# Patient Record
Sex: Male | Born: 1985 | Race: White | Hispanic: No | Marital: Married | Smoking: Never smoker
Health system: Southern US, Community
[De-identification: ages and names within clinical notes are randomized; demographics above are authoritative.]

## PROBLEM LIST (undated history)

## (undated) DIAGNOSIS — E162 Hypoglycemia, unspecified: Secondary | ICD-10-CM

## (undated) DIAGNOSIS — F988 Other specified behavioral and emotional disorders with onset usually occurring in childhood and adolescence: Secondary | ICD-10-CM

## (undated) HISTORY — PX: LACERATION REPAIR: SHX5168

## (undated) HISTORY — DX: Other specified behavioral and emotional disorders with onset usually occurring in childhood and adolescence: F98.8

## (undated) HISTORY — DX: Hypoglycemia, unspecified: E16.2

---
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
## 2003-01-02 ENCOUNTER — Encounter: Payer: Self-pay | Admitting: Emergency Medicine

## 2003-01-02 ENCOUNTER — Emergency Department (HOSPITAL_COMMUNITY): Admission: EM | Admit: 2003-01-02 | Discharge: 2003-01-02 | Payer: Self-pay | Admitting: Emergency Medicine

## 2003-06-12 ENCOUNTER — Emergency Department (HOSPITAL_COMMUNITY): Admission: EM | Admit: 2003-06-12 | Discharge: 2003-06-13 | Payer: Self-pay | Admitting: Emergency Medicine

## 2008-03-15 ENCOUNTER — Emergency Department (HOSPITAL_COMMUNITY): Admission: EM | Admit: 2008-03-15 | Discharge: 2008-03-15 | Payer: Self-pay | Admitting: Emergency Medicine

## 2008-07-10 ENCOUNTER — Ambulatory Visit: Payer: Self-pay | Admitting: Internal Medicine

## 2008-07-10 DIAGNOSIS — F988 Other specified behavioral and emotional disorders with onset usually occurring in childhood and adolescence: Secondary | ICD-10-CM | POA: Insufficient documentation

## 2008-07-10 DIAGNOSIS — E162 Hypoglycemia, unspecified: Secondary | ICD-10-CM

## 2008-11-12 ENCOUNTER — Encounter: Admission: RE | Admit: 2008-11-12 | Discharge: 2008-11-12 | Payer: Self-pay | Admitting: Internal Medicine

## 2010-02-01 ENCOUNTER — Emergency Department (HOSPITAL_COMMUNITY): Admission: EM | Admit: 2010-02-01 | Discharge: 2010-02-01 | Payer: Self-pay | Admitting: Emergency Medicine

## 2010-06-16 IMAGING — CT CT ABD-PELV W/O CM
2 of 4 series · 17 of 46 positions shown, 19 images · non-contrast
Comparison: None

CLINICAL DATA: Flank pain

CT ABDOMEN AND PELVIS WITHOUT CONTRAST
TECHNIQUE: Multidetector CT imaging of the abdomen and pelvis was
performed following the standard protocol without intravenous
contrast.

[Series 2: stone_wo 5.0 b40f st · axial · 0.75mm/px · z∈[-482,-76]mm · 14 of 89 slices shown, 16 images]
[im 4/89  soft-tissue]
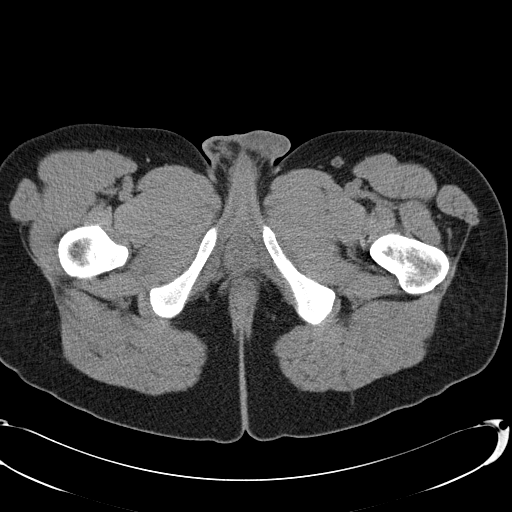
[im 4/89  bone]
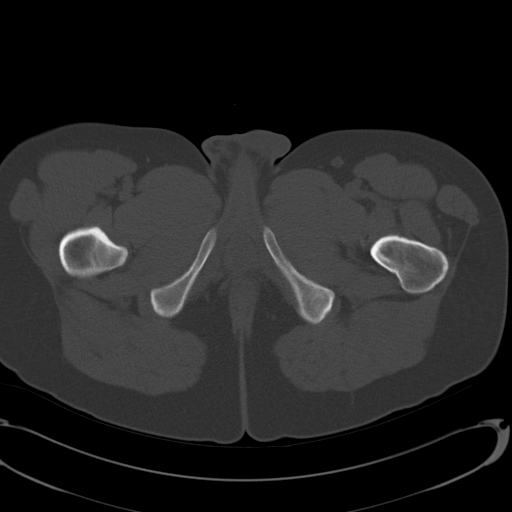
[im 11/89  soft-tissue]
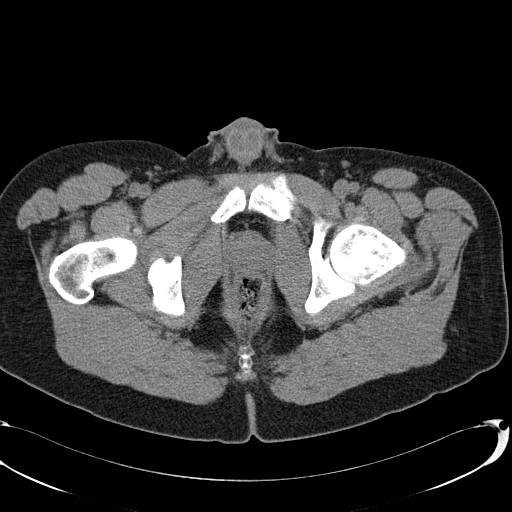
[im 17/89  soft-tissue]
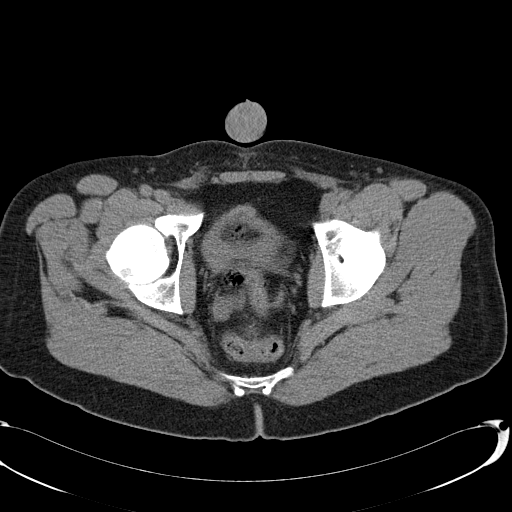
[im 24/89  soft-tissue]
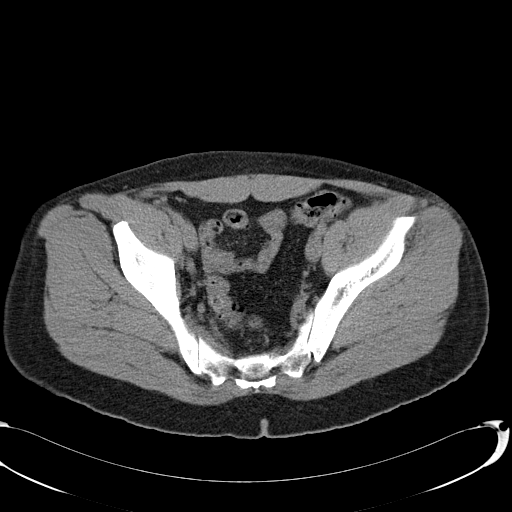
[im 31/89  soft-tissue]
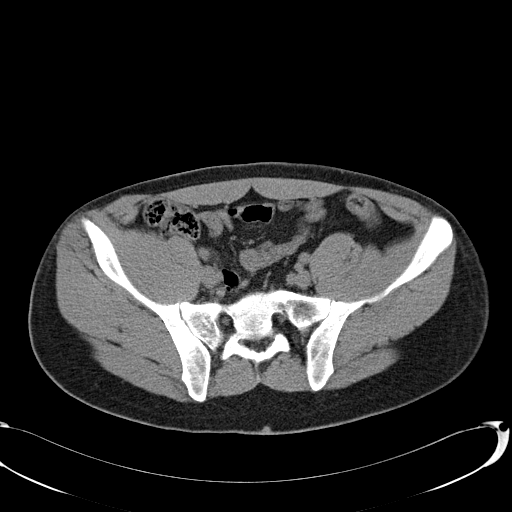
[im 34/89  soft-tissue]
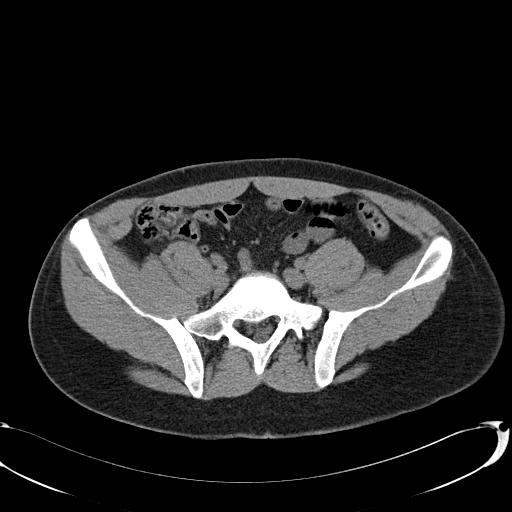
[im 41/89  soft-tissue]
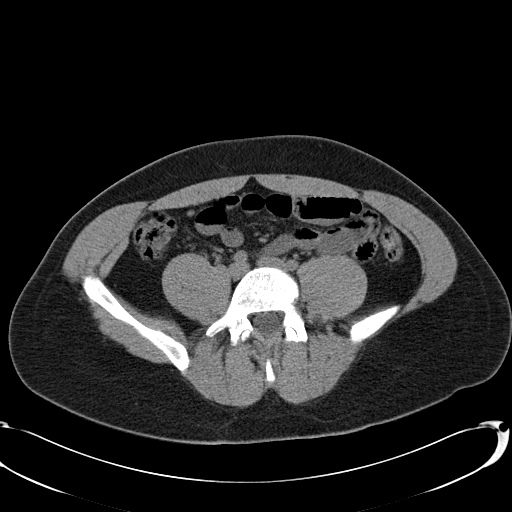
[im 48/89  soft-tissue]
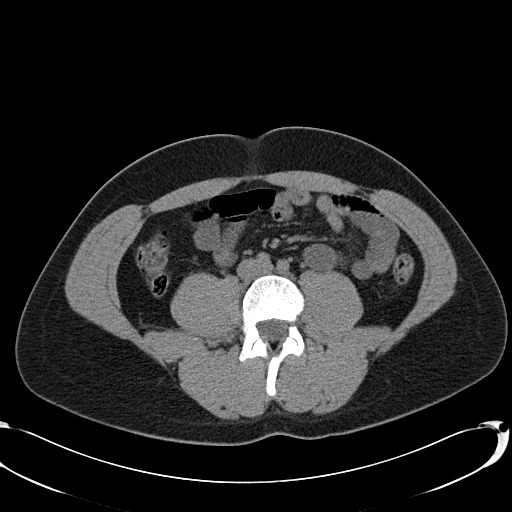
[im 55/89  soft-tissue]
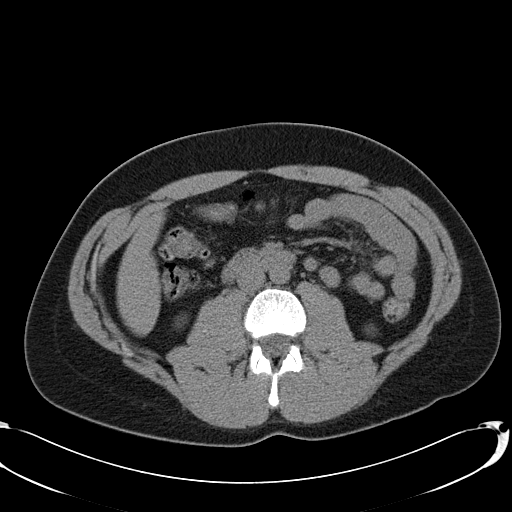
[im 55/89  bone]
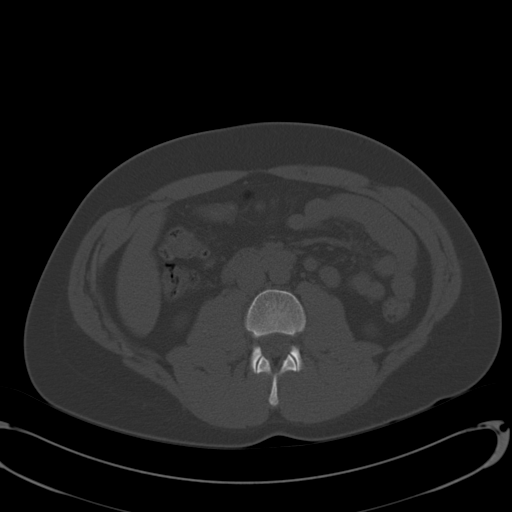
[im 58/89  soft-tissue]
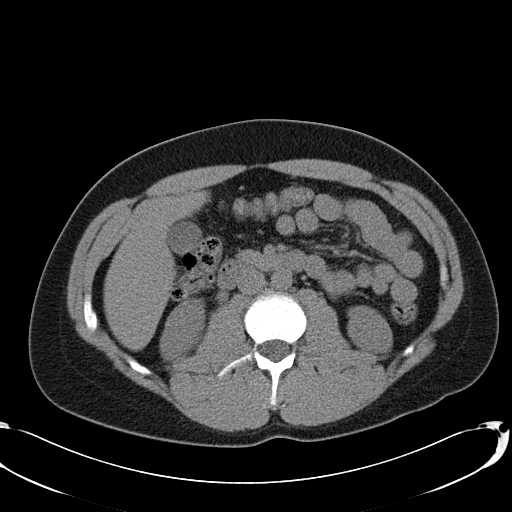
[im 65/89  soft-tissue]
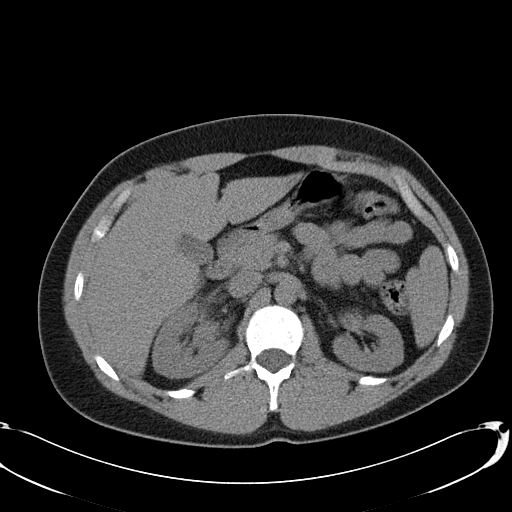
[im 72/89  soft-tissue]
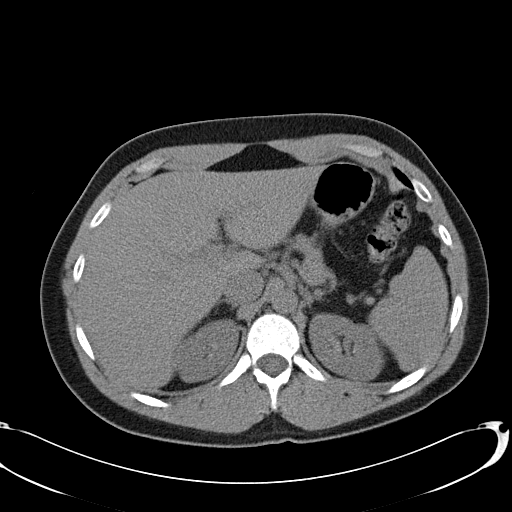
[im 78/89  soft-tissue]
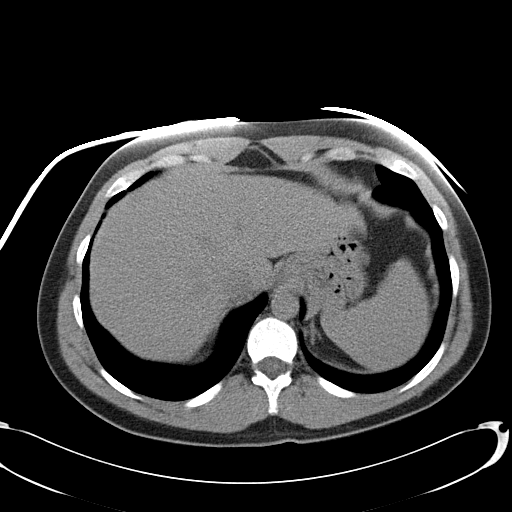
[im 85/89  soft-tissue]
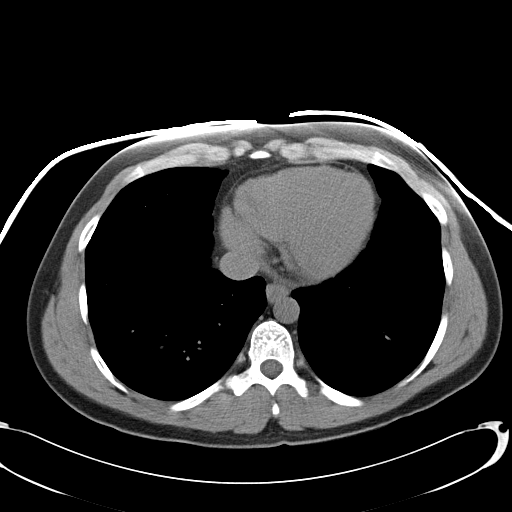

[Series 602: coronal abdomen · coronal · 0.90mm/px · 3 of 121 slices shown]
[im 41/121  soft-tissue]
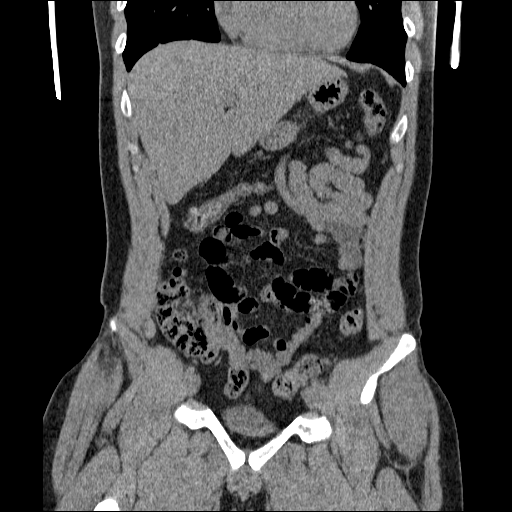
[im 54/121  soft-tissue]
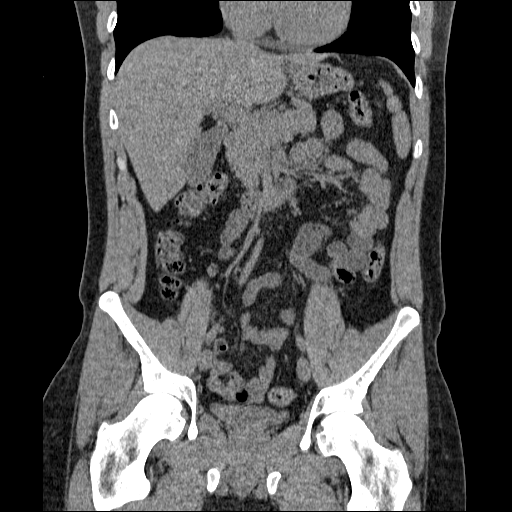
[im 67/121  soft-tissue]
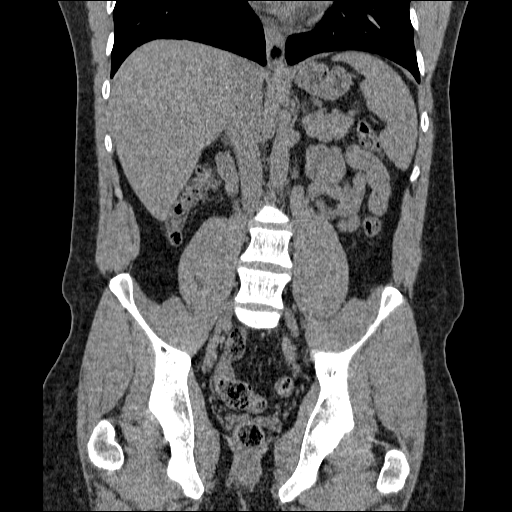

[17 of 46 positions shown; findings below may reference images not displayed]

FINDINGS: The lung bases are clear.

The liver parenchyma appears normal.

The gallbladder is negative.

There is no biliary ductal dilatation. The pancreas appears normal.

The spleen is normal.

Both adrenal glands are normal.

The left kidney appears normal.

There is mild to moderate right-sided hydronephrosis.  4 mm stone
is identified within the right UPJ.

No enlarged lymph nodes within the upper abdomen.

There is no free fluid or abnormal fluid collections.

Appendix identified and normal.

Visualized colon and small bowel are unremarkable.

No free fluid or abnormal fluid collections.

No significant lymphadenopathy.

Urinary bladder is normal.
IMPRESSION: 1.  Right UPJ stone measures 4 mm.
2.  Right-sided hydronephrosis or

## 2011-02-15 LAB — URINE MICROSCOPIC-ADD ON

## 2011-02-15 LAB — URINALYSIS, ROUTINE W REFLEX MICROSCOPIC
Glucose, UA: 100 mg/dL — AB
Specific Gravity, Urine: 1.027 (ref 1.005–1.030)
pH: 8.5 — ABNORMAL HIGH (ref 5.0–8.0)

## 2011-04-17 ENCOUNTER — Inpatient Hospital Stay (HOSPITAL_COMMUNITY): Admission: RE | Admit: 2011-04-17 | Payer: BC Managed Care – PPO | Source: Ambulatory Visit

## 2013-08-03 ENCOUNTER — Other Ambulatory Visit (INDEPENDENT_AMBULATORY_CARE_PROVIDER_SITE_OTHER): Payer: BC Managed Care – PPO

## 2013-08-03 ENCOUNTER — Encounter: Payer: Self-pay | Admitting: Internal Medicine

## 2013-08-03 ENCOUNTER — Ambulatory Visit (INDEPENDENT_AMBULATORY_CARE_PROVIDER_SITE_OTHER): Payer: BC Managed Care – PPO | Admitting: Internal Medicine

## 2013-08-03 VITALS — BP 120/82 | HR 63 | Temp 97.6°F | Wt 221.6 lb

## 2013-08-03 DIAGNOSIS — Z0000 Encounter for general adult medical examination without abnormal findings: Secondary | ICD-10-CM

## 2013-08-03 DIAGNOSIS — E162 Hypoglycemia, unspecified: Secondary | ICD-10-CM

## 2013-08-03 DIAGNOSIS — M7710 Lateral epicondylitis, unspecified elbow: Secondary | ICD-10-CM

## 2013-08-03 DIAGNOSIS — K3189 Other diseases of stomach and duodenum: Secondary | ICD-10-CM

## 2013-08-03 DIAGNOSIS — M7711 Lateral epicondylitis, right elbow: Secondary | ICD-10-CM

## 2013-08-03 DIAGNOSIS — F988 Other specified behavioral and emotional disorders with onset usually occurring in childhood and adolescence: Secondary | ICD-10-CM

## 2013-08-03 DIAGNOSIS — K30 Functional dyspepsia: Secondary | ICD-10-CM

## 2013-08-03 LAB — COMPREHENSIVE METABOLIC PANEL
Albumin: 4.6 g/dL (ref 3.5–5.2)
Alkaline Phosphatase: 49 U/L (ref 39–117)
CO2: 29 mEq/L (ref 19–32)
GFR: 111.33 mL/min (ref >60.00)
Glucose, Bld: 81 mg/dL (ref 70–99)
Potassium: 4.3 mEq/L (ref 3.5–5.1)
Sodium: 138 mEq/L (ref 135–145)
Total Protein: 7.6 g/dL (ref 6.0–8.3)

## 2013-08-03 MED ORDER — AMPHETAMINE-DEXTROAMPHET ER 5 MG PO CP24: 5.0000 mg | ORAL_CAPSULE | ORAL | Status: DC

## 2013-08-03 NOTE — Patient Instructions (Addendum)
1. ADD - an old problem that is resurgent. Plan  Adderall XR 5 mg every AM. Can double to 10 mg (2 tabs) if needed  Report back by MyChart if the dose works and we can prepare additional prescriptions for you  Will need office follow up every 6 months (DEA requirement)  2. 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 You need to avoid high sugar load:  Simple carbohydrates - white rice, white bread, white potatoes, complex is better like sweet potatoes, heavy whole grain products; high protein diet - durable long term slow release energy; regular feedings  3. Arm pain - lateral epicondylitis - an over use inflammation Plan Try not to over use the arm  For flare of pain - rub of choice, NSAID - e.g aleve twice a day short term to relieve the pain, arm band.  4. Stomach - it is in the lower stomach that you have discomfort. May be spastic colon/ IBS type symptoms. Plan First line of treatment is a high fiber diet. Consider a fiber supplement like FiberCon or Benefiber, etc once a day  See handout  5. Health maintenance - routine lab today: cholesterol, sugar level, kidney function  Regular exercise is something you need to build into your life.   Attention Deficit Hyperactivity Disorder Attention deficit hyperactivity disorder (ADHD) is a problem with behavior issues based on the way the brain functions (neurobehavioral disorder). It is a common reason for behavior and academic problems in school. CAUSES  The cause of ADHD is unknown in most cases. It may run in families. It sometimes can be associated with learning disabilities and other behavioral problems. SYMPTOMS  There are 3 types of ADHD. The 3 types and some of the symptoms include:  Inattentive  Gets bored or distracted easily.  Loses or forgets things. Forgets to hand in homework.  Has trouble organizing or completing tasks.  Difficulty staying on task.  An inability to organize daily tasks and school work.  Leaving  projects, chores, or homework unfinished.  Trouble paying attention or responding to details. Careless mistakes.  Difficulty following directions. Often seems like is not listening.  Dislikes activities that require sustained attention (like chores or homework).  Hyperactive-impulsive  Feels like it is impossible to sit still or stay in a seat. Fidgeting with hands and feet.  Trouble waiting turn.  Talking too much or out of turn. Interruptive.  Speaks or acts impulsively.  Aggressive, disruptive behavior.  Constantly busy or on the go, noisy.  Combined  Has symptoms of both of the above. Often children with ADHD feel discouraged about themselves and with school. They often perform well below their abilities in school. These symptoms can cause problems in home, school, and in relationships with peers. As children get older, the excess motor activities can calm down, but the problems with paying attention and staying organized persist. Most children do not outgrow ADHD but with good treatment can learn to cope with the symptoms. DIAGNOSIS  When ADHD is suspected, the diagnosis should be made by professionals trained in ADHD.  Diagnosis will include:  Ruling out other reasons for the child's behavior.  The caregivers will check with the child's school and check their medical records.  They will talk to teachers and parents.  Behavior rating scales for the child will be filled out by those dealing with the child on a daily basis. A diagnosis is made only after all information has been considered. TREATMENT  Treatment usually includes behavioral  treatment often along with medicines. It may include stimulant medicines. The stimulant medicines decrease impulsivity and hyperactivity and increase attention. Other medicines used include antidepressants and certain blood pressure medicines. Most experts agree that treatment for ADHD should address all aspects of the child's functioning.  Treatment should not be limited to the use of medicines alone. Treatment should include structured classroom management. The parents must receive education to address rewarding good behavior, discipline, and limit-setting. Tutoring or behavioral therapy or both should be available for the child. If untreated, the disorder can have long-term serious effects into adolescence and adulthood. HOME CARE INSTRUCTIONS   Often with ADHD there is a lot of frustration among the family in dealing with the illness. There is often blame and anger that is not warranted. This is a life long illness. There is no way to prevent ADHD. In many cases, because the problem affects the family as a whole, the entire family may need help. A therapist can help the family find better ways to handle the disruptive behaviors and promote change. If the child is young, most of the therapist's work is with the parents. Parents will learn techniques for coping with and improving their child's behavior. Sometimes only the child with the ADHD needs counseling. Your caregivers can help you make these decisions.  Children with ADHD may need help in organizing. Some helpful tips include:  Keep routines the same every day from wake-up time to bedtime. Schedule everything. This includes homework and playtime. This should include outdoor and indoor recreation. Keep the schedule on the refrigerator or a bulletin board where it is frequently seen. Mark schedule changes as far in advance as possible.  Have a place for everything and keep everything in its place. This includes clothing, backpacks, and school supplies.  Encourage writing down assignments and bringing home needed books.  Offer your child a well-balanced diet. Breakfast is especially important for school performance. Children should avoid drinks with caffeine including:  Soft drinks.  Coffee.  Tea.  However, some older children (adolescents) may find these drinks helpful in  improving their attention.  Children with ADHD need consistent rules that they can understand and follow. If rules are followed, give small rewards. Children with ADHD often receive, and expect, criticism. Look for good behavior and praise it. Set realistic goals. Give clear instructions. Look for activities that can foster success and self-esteem. Make time for pleasant activities with your child. Give lots of affection.  Parents are their children's greatest advocates. Learn as much as possible about ADHD. This helps you become a stronger and better advocate for your child. It also helps you educate your child's teachers and instructors if they feel inadequate in these areas. Parent support groups are often helpful. A national group with local chapters is called CHADD (Children and Adults with Attention Deficit Hyperactivity Disorder). PROGNOSIS  There is no cure for ADHD. Children with the disorder seldom outgrow it. Many find adaptive ways to accommodate the ADHD as they mature. SEEK MEDICAL CARE IF:  Your child has repeated muscle twitches, cough or speech outbursts.  Your child has sleep problems.  Your child has a marked loss of appetite.  Your child develops depression.  Your child has new or worsening behavioral problems.  Your child develops dizziness.  Your child has a racing heart.  Your child has stomach pains.  Your child develops headaches. Document Released: 10/30/2002 Document Revised: 02/01/2012 Document Reviewed: 06/11/2008 [facility] Patient Information 2014 [place], [place].   Lateral Epicondylitis (  Tennis Elbow) with Rehab Lateral epicondylitis involves inflammation and pain around the outer portion of the elbow. The pain is caused by inflammation of the tendons in the forearm that bring back (extend) the wrist. Lateral epicondylittis is also called tennis elbow, because it is very common in tennis players. However, it may occur in any individual who extends the  wrist repetitively. If lateral epicondylitis is left untreated, it may become a chronic problem. SYMPTOMS   Pain, tenderness, and inflammation on the outer (lateral) side of the elbow.  Pain or weakness with gripping activities.  Pain that increases with wrist twisting motions (playing tennis, using a screwdriver, opening a door or a jar).  Pain with lifting objects, including a coffee cup. CAUSES  Lateral epicondylitis is caused by inflammation of the tendons that extend the wrist. Causes of injury may include:  Repetitive stress and strain on the muscles and tendons that extend the wrist.  Sudden change in activity level or intensity.  Incorrect grip in racquet sports.  Incorrect grip size of racquet (often too large).  Incorrect hitting position or technique (usually backhand, leading with the elbow).  Using a racket that is too heavy. RISK INCREASES WITH:  Sports or occupations that require repetitive and/or strenuous forearm and wrist movements (tennis, squash, racquetball, carpentry).  Poor wrist and forearm strength and flexibility.  Failure to warm up properly before activity.  Resuming activity before healing, rehabilitation, and conditioning are complete. PREVENTION   Warm up and stretch properly before activity.  Maintain physical fitness:  Strength, flexibility, and endurance.  Cardiovascular fitness.  Wear and use properly fitted equipment.  Learn and use proper technique and have a coach correct improper technique.  Wear a tennis elbow (counterforce) brace. PROGNOSIS  The course of this condition depends on the degree of the injury. If treated properly, acute cases (symptoms lasting less than 4 weeks) are often resolved in 2 to 6 weeks. Chronic (longer lasting cases) often resolve in 3 to 6 months, but may require physical therapy. RELATED COMPLICATIONS   Frequently recurring symptoms, resulting in a chronic problem. Properly treating the problem the  first time decreases frequency of recurrence.  Chronic inflammation, scarring tendon degeneration, and partial tendon tear, requiring surgery.  Delayed healing or resolution of symptoms. TREATMENT  Treatment first involves the use of ice and medicine, to reduce pain and inflammation. Strengthening and stretching exercises may help reduce discomfort, if performed regularly. These exercises may be performed at home, if the condition is an acute injury. Chronic cases may require a referral to a physical therapist for evaluation and treatment. Your caregiver may advise a corticosteroid injection, to help reduce inflammation. Rarely, surgery is needed. MEDICATION  If pain medicine is needed, nonsteroidal anti-inflammatory medicines (aspirin and ibuprofen), or other minor pain relievers (acetaminophen), are often advised.  Do not take pain medicine for 7 days before surgery.  Prescription pain relievers may be given, if your caregiver thinks they are needed. Use only as directed and only as much as you need.  Corticosteroid injections may be recommended. These injections should be reserved only for the most severe cases, because they can only be given a certain number of times. HEAT AND COLD  Cold treatment (icing) should be applied for 10 to 15 minutes every 2 to 3 hours for inflammation and pain, and immediately after activity that aggravates your symptoms. Use ice packs or an ice massage.  Heat treatment may be used before performing stretching and strengthening activities prescribed by your caregiver,  physical therapist, or athletic trainer. Use a heat pack or a warm water soak. SEEK MEDICAL CARE IF: Symptoms get worse or do not improve in 2 weeks, despite treatment. EXERCISES  RANGE OF MOTION (ROM) AND STRETCHING EXERCISES - Epicondylitis, Lateral (Tennis Elbow) These exercises may help you when beginning to rehabilitate your injury. Your symptoms may go away with or without further  involvement from your physician, physical therapist or athletic trainer. While completing these exercises, remember:   Restoring tissue flexibility helps normal motion to return to the joints. This allows healthier, less painful movement and activity.  An effective stretch should be held for at least 30 seconds.  A stretch should never be painful. You should only feel a gentle lengthening or release in the stretched tissue. RANGE OF MOTION  Wrist Flexion, Active-Assisted  Extend your right / left elbow with your fingers pointing down.*  Gently pull the back of your hand towards you, until you feel a gentle stretch on the top of your forearm.  Hold this position for __________ seconds. Repeat __________ times. Complete this exercise __________ times per day.  *If directed by your physician, physical therapist or athletic trainer, complete this stretch with your elbow bent, rather than extended. RANGE OF MOTION  Wrist Extension, Active-Assisted  Extend your right / left elbow and turn your palm upwards.*  Gently pull your palm and fingertips back, so your wrist extends and your fingers point more toward the ground.  You should feel a gentle stretch on the inside of your forearm.  Hold this position for __________ seconds. Repeat __________ times. Complete this exercise __________ times per day. *If directed by your physician, physical therapist or athletic trainer, complete this stretch with your elbow bent, rather than extended. STRETCH - Wrist Flexion  Place the back of your right / left hand on a tabletop, leaving your elbow slightly bent. Your fingers should point away from your body.  Gently press the back of your hand down onto the table by straightening your elbow. You should feel a stretch on the top of your forearm.  Hold this position for __________ seconds. Repeat __________ times. Complete this stretch __________ times per day.  STRETCH  Wrist Extension   Place your  right / left fingertips on a tabletop, leaving your elbow slightly bent. Your fingers should point backwards.  Gently press your fingers and palm down onto the table by straightening your elbow. You should feel a stretch on the inside of your forearm.  Hold this position for __________ seconds. Repeat __________ times. Complete this stretch __________ times per day.  STRENGTHENING EXERCISES - Epicondylitis, Lateral (Tennis Elbow) These exercises may help you when beginning to rehabilitate your injury. They may resolve your symptoms with or without further involvement from your physician, physical therapist or athletic trainer. While completing these exercises, remember:   Muscles can gain both the endurance and the strength needed for everyday activities through controlled exercises.  Complete these exercises as instructed by your physician, physical therapist or athletic trainer. Increase the resistance and repetitions only as guided.  You may experience muscle soreness or fatigue, but the pain or discomfort you are trying to eliminate should never worsen during these exercises. If this pain does get worse, stop and make sure you are following the directions exactly. If the pain is still present after adjustments, discontinue the exercise until you can discuss the trouble with your caregiver. STRENGTH Wrist Flexors  Sit with your right / left forearm palm-up and fully  supported on a table or countertop. Your elbow should be resting below the height of your shoulder. Allow your wrist to extend over the edge of the surface.  Loosely holding a __________ weight, or a piece of rubber exercise band or tubing, slowly curl your hand up toward your forearm.  Hold this position for __________ seconds. Slowly lower the wrist back to the starting position in a controlled manner. Repeat __________ times. Complete this exercise __________ times per day.  STRENGTH  Wrist Extensors  Sit with your right /  left forearm palm-down and fully supported on a table or countertop. Your elbow should be resting below the height of your shoulder. Allow your wrist to extend over the edge of the surface.  Loosely holding a __________ weight, or a piece of rubber exercise band or tubing, slowly curl your hand up toward your forearm.  Hold this position for __________ seconds. Slowly lower the wrist back to the starting position in a controlled manner. Repeat __________ times. Complete this exercise __________ times per day.  STRENGTH - Ulnar Deviators  Stand with a ____________________ weight in your right / left hand, or sit while holding a rubber exercise band or tubing, with your healthy arm supported on a table or countertop.  Move your wrist, so that your pinkie travels toward your forearm and your thumb moves away from your forearm.  Hold this position for __________ seconds and then slowly lower the wrist back to the starting position. Repeat __________ times. Complete this exercise __________ times per day STRENGTH - Radial Deviators  Stand with a ____________________ weight in your right / left hand, or sit while holding a rubber exercise band or tubing, with your injured arm supported on a table or countertop.  Raise your hand upward in front of you or pull up on the rubber tubing.  Hold this position for __________ seconds and then slowly lower the wrist back to the starting position. Repeat __________ times. Complete this exercise __________ times per day. STRENGTH  Forearm Supinators   Sit with your right / left forearm supported on a table, keeping your elbow below shoulder height. Rest your hand over the edge, palm down.  Gently grip a hammer or a soup ladle.  Without moving your elbow, slowly turn your palm and hand upward to a "thumbs-up" position.  Hold this position for __________ seconds. Slowly return to the starting position. Repeat __________ times. Complete this exercise  __________ times per day.  STRENGTH  Forearm Pronators   Sit with your right / left forearm supported on a table, keeping your elbow below shoulder height. Rest your hand over the edge, palm up.  Gently grip a hammer or a soup ladle.  Without moving your elbow, slowly turn your palm and hand upward to a "thumbs-up" position.  Hold this position for __________ seconds. Slowly return to the starting position. Repeat __________ times. Complete this exercise __________ times per day.  STRENGTH - Grip  Grasp a tennis ball, a dense sponge, or a large, rolled sock in your hand.  Squeeze as hard as you can, without increasing any pain.  Hold this position for __________ seconds. Release your grip slowly. Repeat __________ times. Complete this exercise __________ times per day.  STRENGTH - Elbow Extensors, Isometric  Stand or sit upright, on a firm surface. Place your right / left arm so that your palm faces your stomach, and it is at the height of your waist.  Place your opposite hand on the  underside of your forearm. Gently push up as your right / left arm resists. Push as hard as you can with both arms, without causing any pain or movement at your right / left elbow. Hold this stationary position for __________ seconds. Gradually release the tension in both arms. Allow your muscles to relax completely before repeating. Document Released: 11/09/2005 Document Revised: 02/01/2012 Document Reviewed: 02/21/2009 [facility] Patient Information 2014 [place], [place].   Diet and Irritable Bowel Syndrome  No cure has been found for irritable bowel syndrome (IBS). Many options are available to treat the symptoms. Your caregiver will give you the best treatments available for your symptoms. He or she will also encourage you to manage stress and to make changes to your diet. You need to work with your caregiver and Registered Dietician to find the best combination of medicine, diet, counseling, and support  to control your symptoms. The following are some diet suggestions. FOODS THAT MAKE IBS WORSE  Fatty foods, such as [place] fries.  Milk products, such as cheese or ice cream.  Chocolate.  Alcohol.  Caffeine (found in coffee and some sodas).  Carbonated drinks, such as soda. If certain foods cause symptoms, you should eat less of them or stop eating them. FOOD JOURNAL   Keep a journal of the foods that seem to cause distress. Write down:  What you are eating during the day and when.  What problems you are having after eating.  When the symptoms occur in relation to your meals.  What foods always make you feel badly.  Take your notes with you to your caregiver to see if you should stop eating certain foods. FOODS THAT MAKE IBS BETTER Fiber reduces IBS symptoms, especially constipation, because it makes stools soft, bulky, and easier to pass. Fiber is found in bran, bread, cereal, beans, fruit, and vegetables. Examples of foods with fiber include:  Apples.  Peaches.  Pears.  Berries.  Figs.  Broccoli, raw.  Cabbage.  Carrots.  Raw peas.  Kidney beans.  Lima beans.  Whole-grain bread.  Whole-grain cereal. Add foods with fiber to your diet a little at a time. This will let your body get used to them. Too much fiber at once might cause gas and swelling of your abdomen. This can trigger symptoms in a person with IBS. Caregivers usually recommend a diet with enough fiber to produce soft, painless bowel movements. High fiber diets may cause gas and bloating. However, these symptoms often go away within a few weeks, as your body adjusts. In many cases, dietary fiber may lessen IBS symptoms, particularly constipation. However, it may not help pain or diarrhea. High fiber diets keep the colon mildly enlarged (distended) with the added fiber. This may help prevent spasms in the colon. Some forms of fiber also keep water in the stool, thereby preventing hard stools that are  difficult to pass.  Besides telling you to eat more foods with fiber, your caregiver may also tell you to get more fiber by taking a fiber pill or drinking water mixed with a special high fiber powder. An example of this is a natural fiber laxative containing psyllium seed.  TIPS  Large meals can cause cramping and diarrhea in people with IBS. If this happens to you, try eating 4 or 5 small meals a day, or try eating less at each of your usual 3 meals. It may also help if your meals are low in fat and high in carbohydrates. Examples of carbohydrates are pasta, rice,  whole-grain breads and cereals, fruits, and vegetables.  If dairy products cause your symptoms to flare up, you can try eating less of those foods. You might be able to handle yogurt better than other dairy products, because it contains bacteria that helps with digestion. Dairy products are an important source of calcium and other nutrients. If you need to avoid dairy products, be sure to talk with a Registered Dietitian about getting these nutrients through other food sources.  Drink enough water and fluids to keep your urine clear or pale yellow. This is important, especially if you have diarrhea. FOR MORE INFORMATION  International Foundation for Functional Gastrointestinal Disorders: www.iffgd.org  National Digestive Diseases Information Clearinghouse: digestive.StageSync.si Document Released: 01/30/2004 Document Revised: 02/01/2012 Document Reviewed: 10/17/2007 [facility] Patient Information 2014 [place], [place].

## 2013-08-03 NOTE — Progress Notes (Signed)
  Subjective:    Patient ID: Aaron Singleton, male    DOB: 1986-02-06, 27 y.o.   MRN: [id number]  HPI [name] presents with a recent c/o left shoulder pain that came on spontaneously but resolved over a period of days. He does not recall any injury.  He also has right elbow pain that will be accompanied by swelling. He does do a lot of physical activity. He does use a forearm band. Currently his arm is not particularly painful.   He has chronic abdominal pain most days. A sharp pain when most active but also a low level of discomfort. Not affected by food or beverage. No N/V. He has chronic loose stools. He does have a lot of flatus and does have occasional bloating. Discomfort does not seem to vary with work load or stress. He has not tried any medication.  He has a h/o ADD and was on adderall in the past: diagnosed in primary school and took it through high school. He is having trouble concentrating, like it was before, and would like to resume medication.  Interval history since last visit in '09 facial laceration left cheek from a flying blade at work.   Past Medical History  Diagnosis Date  . ADD (attention deficit disorder)   . Hypoglycemia, unspecified     reactive hypoglycemia in the past   Past Surgical History  Procedure Laterality Date  . Laceration repair      left zygoma along with underlying fracture   Family History  Problem Relation Age of Onset  . Hyperlipidemia Mother   . Heart disease Maternal Uncle     MI, CABG  . Heart disease Maternal Grandfather   . Diabetes Neg Hx   . COPD Neg Hx   . Cancer Neg Hx    History   Social History  . Marital Status: Married    Spouse Name: [name]    Number of Children: 0  . Years of Education: 14   Occupational History  . Financial planner    Social History Main Topics  . Smoking status: Never Smoker   . Smokeless tobacco: Never Used  . Alcohol Use: Yes     Comment: minimal use   . Drug Use: No  . Sexual Activity:  Yes    Partners: Female   Other Topics Concern  . Not on file   Social History Narrative   HS[employer] '06; Highest level education -some college. Married June '14. Work - Therapist, art. Lives with wife. Marriage is doing well.         No meds  Review of Systems System review is negative for any constitutional, cardiac, pulmonary, GI or neuro symptoms or complaints other than as described in the HPI.     Objective:   Physical Exam Filed Vitals:   08/03/13 1113  BP: 120/82  Pulse: 63  Temp: 97.6 F (36.4 C)   Filed Vitals:   08/03/13 1113  BP: 120/82  Pulse: 63  Temp: 97.6 F (36.4 C)   gen'l WNWD white man in no distress HEENT - [place]/AT, PERRLA Neck supple, no thyromegaly Cor- 2+ radial pulse, RRR Pulm - CTAP Abd - BS+ x 4, no guarding or rebound, no epigastric tenderness, mild tenderness in the lower quadrant L>R Neuro - A&O x 3        Assessment & Plan:

## 2013-08-04 ENCOUNTER — Encounter: Payer: Self-pay | Admitting: Internal Medicine

## 2013-08-05 DIAGNOSIS — K589 Irritable bowel syndrome without diarrhea: Secondary | ICD-10-CM | POA: Insufficient documentation

## 2013-08-05 DIAGNOSIS — M7711 Lateral epicondylitis, right elbow: Secondary | ICD-10-CM | POA: Insufficient documentation

## 2013-08-05 DIAGNOSIS — Z0000 Encounter for general adult medical examination without abnormal findings: Secondary | ICD-10-CM | POA: Insufficient documentation

## 2013-08-05 NOTE — Assessment & Plan Note (Signed)
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   Plan -   avoid high sugar load:  Simple carbohydrates - white rice, white bread, white potatoes, complex is better like sweet potatoes, heavy whole grain products  high protein diet - durable long term slow release energy; regular feedings

## 2013-08-05 NOTE — Assessment & Plan Note (Signed)
it is in the lower stomach that you have discomfort. May be spastic colon/ IBS type symptoms. Plan First line of treatment is a high fiber diet. Consider a fiber supplement like FiberCon or Benefiber, etc once a day  See handout

## 2013-08-05 NOTE — Assessment & Plan Note (Signed)
Generally healthy young man. routine lab today: cholesterol, sugar level, kidney function  Regular exercise is something you need to build into your life.   Recent Results (from the past 2160 hour(s))  LIPID PANEL     Status: Abnormal   Collection Time    08/03/13 12:35 PM      Result Value Range   Cholesterol 170  0 - 200 mg/dL   Comment: ATP III Classification       Desirable:  < 200 mg/dL               Borderline High:  200 - 239 mg/dL          High:  > = 161 mg/dL   Triglycerides 096.0  0.0 - 149.0 mg/dL   Comment: Normal:  <454 mg/dLBorderline High:  150 - 199 mg/dL   HDL 09.81  >19.14 mg/dL   VLDL 78.2  0.0 - 95.6 mg/dL   LDL Cholesterol 213 (*) 0 - 99 mg/dL   Total CHOL/HDL Ratio 4     Comment:                Men          Women1/2 Average Risk     3.4          3.3Average Risk          5.0          4.42X Average Risk          9.6          7.13X Average Risk          15.0          11.0                      COMPREHENSIVE METABOLIC PANEL     Status: None   Collection Time    08/03/13 12:35 PM      Result Value Range   Sodium 138  135 - 145 mEq/L   Potassium 4.3  3.5 - 5.1 mEq/L   Chloride 106  96 - 112 mEq/L   CO2 29  19 - 32 mEq/L   Glucose, Bld 81  70 - 99 mg/dL   BUN 13  6 - 23 mg/dL   Creatinine, Ser 0.9  0.4 - 1.5 mg/dL   Total Bilirubin 0.7  0.3 - 1.2 mg/dL   Alkaline Phosphatase 49  39 - 117 U/L   AST 27  0 - 37 U/L   ALT 41  0 - 53 U/L   Total Protein 7.6  6.0 - 8.3 g/dL   Albumin 4.6  3.5 - 5.2 g/dL   Calcium 9.9  8.4 - 08.6 mg/dL   GFR 578.46  >96.29 mL/min

## 2013-08-05 NOTE — Assessment & Plan Note (Signed)
lateral epicondylitis - an over use inflammation Plan Try not to over use the arm  For flare of pain - rub of choice, NSAID - e.g aleve twice a day short term to relieve the pain, arm band.

## 2013-08-05 NOTE — Assessment & Plan Note (Signed)
ADD - an old problem that is resurgent. Plan  Adderall XR 5 mg every AM. Can double to 10 mg (2 tabs) if needed  Report back by MyChart if the dose works and we can prepare additional prescriptions for you  Will need office follow up every 6 months (DEA requirement)

## 2013-08-09 ENCOUNTER — Telehealth: Payer: Self-pay

## 2013-08-09 NOTE — Telephone Encounter (Signed)
Prior authorization for Dextroamphetamine-Amph Cap Sr has been approved effective 07/10/13 through 08/08/2016.

## 2013-08-20 ENCOUNTER — Encounter: Payer: Self-pay | Admitting: Internal Medicine

## 2013-08-22 ENCOUNTER — Other Ambulatory Visit: Payer: Self-pay

## 2013-08-22 MED ORDER — AMPHETAMINE-DEXTROAMPHET ER 20 MG PO CP24: 20.0000 mg | ORAL_CAPSULE | ORAL | Status: DC

## 2013-09-28 ENCOUNTER — Other Ambulatory Visit: Payer: Self-pay

## 2013-10-24 ENCOUNTER — Telehealth: Payer: Self-pay | Admitting: Internal Medicine

## 2013-10-24 MED ORDER — AMPHETAMINE-DEXTROAMPHET ER 20 MG PO CP24: 20.0000 mg | ORAL_CAPSULE | ORAL | Status: DC

## 2013-10-24 NOTE — Telephone Encounter (Signed)
10/24/2013  Pt is in lobby.  Pt is here to pick up RX amphetamine-dextroamphetamine (ADDERALL XR) 20 MG 24 hr capsule, but there is no RX to pick up.  According to email encounter from 10/17/2013, the RX would be ready for pick up.  Gave info to CMA as well.

## 2013-10-24 NOTE — Telephone Encounter (Signed)
10/24/2013  Pt had to leave, but would like to be called whenever the RX is ready for pickup.  # [phone]

## 2013-10-24 NOTE — Telephone Encounter (Signed)
Phone call to patient letting him know his script for Adderall can be picked up. Script is on my desk so it does not get misplaced this time.

## 2013-12-19 MED ORDER — AMPHETAMINE-DEXTROAMPHET ER 20 MG PO CP24: 20.0000 mg | ORAL_CAPSULE | ORAL | Status: DC

## 2013-12-19 NOTE — Addendum Note (Signed)
Addended by: Lyanne CoANDREWS, Felita Bump R on: 12/19/2013 02:11 PM   Modules accepted: Orders, Medications

## 2015-03-04 ENCOUNTER — Encounter (HOSPITAL_COMMUNITY): Payer: Self-pay | Admitting: Emergency Medicine

## 2015-03-04 ENCOUNTER — Emergency Department (HOSPITAL_COMMUNITY)
Admission: EM | Admit: 2015-03-04 | Discharge: 2015-03-04 | Disposition: A | Discharge status: Home / Self Care | Payer: Managed Care, Other (non HMO) | Attending: Emergency Medicine | Admitting: Emergency Medicine

## 2015-03-04 DIAGNOSIS — F909 Attention-deficit hyperactivity disorder, unspecified type: Secondary | ICD-10-CM | POA: Diagnosis not present

## 2015-03-04 DIAGNOSIS — Z8639 Personal history of other endocrine, nutritional and metabolic disease: Secondary | ICD-10-CM | POA: Diagnosis not present

## 2015-03-04 DIAGNOSIS — R110 Nausea: Secondary | ICD-10-CM | POA: Insufficient documentation

## 2015-03-04 DIAGNOSIS — R109 Unspecified abdominal pain: Secondary | ICD-10-CM | POA: Diagnosis not present

## 2015-03-04 DIAGNOSIS — Z87442 Personal history of urinary calculi: Secondary | ICD-10-CM | POA: Insufficient documentation

## 2015-03-04 LAB — CBC WITH DIFFERENTIAL/PLATELET
BASOS ABS: 0 10*3/uL (ref 0.0–0.1)
BASOS PCT: 0 % (ref 0–1)
EOS ABS: 0 10*3/uL (ref 0.0–0.7)
Eosinophils Relative: 0 % (ref 0–5)
HEMATOCRIT: 46 % (ref 39.0–52.0)
HEMOGLOBIN: 16.4 g/dL (ref 13.0–17.0)
Lymphocytes Relative: 13 % (ref 12–46)
Lymphs Abs: 2.4 10*3/uL (ref 0.7–4.0)
MCH: 31 pg (ref 26.0–34.0)
MCHC: 35.7 g/dL (ref 30.0–36.0)
MCV: 87 fL (ref 78.0–100.0)
MONOS PCT: 4 % (ref 3–12)
Monocytes Absolute: 0.8 10*3/uL (ref 0.1–1.0)
NEUTROS ABS: 16.1 10*3/uL — AB (ref 1.7–7.7)
NEUTROS PCT: 83 % — AB (ref 43–77)
PLATELETS: 354 10*3/uL (ref 150–400)
RBC: 5.29 MIL/uL (ref 4.22–5.81)
RDW: 12.4 % (ref 11.5–15.5)
WBC: 19.4 10*3/uL — AB (ref 4.0–10.5)

## 2015-03-04 LAB — COMPREHENSIVE METABOLIC PANEL
ALT: 44 U/L (ref 0–53)
ANION GAP: 13 (ref 5–15)
AST: 29 U/L (ref 0–37)
Albumin: 4.9 g/dL (ref 3.5–5.2)
Alkaline Phosphatase: 58 U/L (ref 39–117)
BILIRUBIN TOTAL: 0.7 mg/dL (ref 0.3–1.2)
BUN: 17 mg/dL (ref 6–23)
CO2: 19 mmol/L (ref 19–32)
CREATININE: 1.27 mg/dL (ref 0.50–1.35)
Calcium: 10 mg/dL (ref 8.4–10.5)
Chloride: 105 mmol/L (ref 96–112)
GFR calc Af Amer: 86 mL/min — ABNORMAL LOW (ref >90)
GFR, EST NON AFRICAN AMERICAN: 74 mL/min — AB (ref >90)
Glucose, Bld: 164 mg/dL — ABNORMAL HIGH (ref 70–99)
Potassium: 3.8 mmol/L (ref 3.5–5.1)
SODIUM: 137 mmol/L (ref 135–145)
Total Protein: 8.2 g/dL (ref 6.0–8.3)

## 2015-03-04 LAB — URINALYSIS, ROUTINE W REFLEX MICROSCOPIC
BILIRUBIN URINE: NEGATIVE
Glucose, UA: NEGATIVE mg/dL
Hgb urine dipstick: NEGATIVE
Ketones, ur: >80 mg/dL — AB
LEUKOCYTES UA: NEGATIVE
NITRITE: NEGATIVE
Protein, ur: NEGATIVE mg/dL
Specific Gravity, Urine: 1.037 — ABNORMAL HIGH (ref 1.005–1.030)
UROBILINOGEN UA: 0.2 mg/dL (ref 0.0–1.0)
pH: 7 (ref 5.0–8.0)

## 2015-03-04 MED ORDER — HYDROMORPHONE HCL 1 MG/ML IJ SOLN
1.0000 mg | Freq: Once | INTRAMUSCULAR | Status: AC
Start: 2015-03-04 — End: 2015-03-04
  Administered 2015-03-04: 1 mg via INTRAVENOUS
  Filled 2015-03-04: qty 1

## 2015-03-04 MED ORDER — OXYCODONE-ACETAMINOPHEN 5-325 MG PO TABS
1.0000 | ORAL_TABLET | ORAL | Status: AC | PRN
Start: 2015-03-04 — End: ?

## 2015-03-04 MED ORDER — SODIUM CHLORIDE 0.9 % IV BOLUS (SEPSIS)
1000.0000 mL | Freq: Once | INTRAVENOUS | Status: AC
  Administered 2015-03-04: 1000 mL via INTRAVENOUS

## 2015-03-04 MED ORDER — FENTANYL CITRATE 0.05 MG/ML IJ SOLN
50.0000 ug | Freq: Once | INTRAMUSCULAR | Status: AC
  Administered 2015-03-04: 50 ug via NASAL

## 2015-03-04 MED ORDER — FENTANYL CITRATE 0.05 MG/ML IJ SOLN
INTRAMUSCULAR | Status: AC
  Filled 2015-03-04: qty 2

## 2015-03-04 MED ORDER — HYDROMORPHONE HCL 1 MG/ML IJ SOLN
1.0000 mg | Freq: Once | INTRAMUSCULAR | Status: AC
  Administered 2015-03-04: 1 mg via INTRAVENOUS
  Filled 2015-03-04: qty 1

## 2015-03-04 NOTE — ED Notes (Signed)
Pt to room and in gown. Pt complaining of left flank pain. Pt has hx of 6 prior kidney stones.

## 2015-03-04 NOTE — ED Notes (Signed)
Pt verbalized understanding of d/c instructions and has no further questions. Pt to follow up with urologist

## 2015-03-04 NOTE — ED Notes (Signed)
Dr. Wickline in room. 

## 2015-03-04 NOTE — ED Provider Notes (Signed)
CSN: [id number]     Arrival date & time 03/04/15  2018 History   First MD Initiated Contact with Patient 03/04/15 2142     Chief Complaint  Patient presents with  . Nephrolithiasis    The patient has had kidney stones before and he knows this pain is similar.  He is haivng right sided flank pain and rates it 9/10.     Patient is a 29 y.o. male presenting with flank pain. The history is provided by the patient.  Flank Pain This is a new problem. Episode onset: today. The problem occurs constantly. The problem has been gradually worsening. Associated symptoms include abdominal pain. Pertinent negatives include no chest pain and no shortness of breath. Nothing aggravates the symptoms. Nothing relieves the symptoms.  Pt reports onset of left flank pain today He reports nausea He reports this is similar to prior episodes of pain with kidney stones but is worse He denies h/o urologic procedure   Past Medical History  Diagnosis Date  . ADD (attention deficit disorder)   . Hypoglycemia, unspecified     reactive hypoglycemia in the past   Past Surgical History  Procedure Laterality Date  . Laceration repair      left zygoma along with underlying fracture   Family History  Problem Relation Age of Onset  . Hyperlipidemia Mother   . Heart disease Maternal Uncle     MI, CABG  . Heart disease Maternal Grandfather   . Diabetes Neg Hx   . COPD Neg Hx   . Cancer Neg Hx    History  Substance Use Topics  . Smoking status: Never Smoker   . Smokeless tobacco: Never Used  . Alcohol Use: Yes     Comment: minimal use     Review of Systems  Constitutional: Positive for chills. Negative for fever.  Respiratory: Negative for shortness of breath.   Cardiovascular: Negative for chest pain.  Gastrointestinal: Positive for abdominal pain.  Genitourinary: Positive for flank pain and difficulty urinating.  Neurological: Negative for weakness.  All other systems reviewed and are  negative.     Allergies  Codeine  Home Medications   Prior to Admission medications   Medication Sig Start Date End Date Taking? Authorizing Provider  amphetamine-dextroamphetamine (ADDERALL XR) 20 MG 24 hr capsule Take 1 capsule (20 mg total) by mouth every morning. Patient not taking: Reported on 03/04/2015 10/24/13   Jacques Navy, MD  amphetamine-dextroamphetamine (ADDERALL XR) 20 MG 24 hr capsule Take 1 capsule (20 mg total) by mouth every morning. MAY FILL 02/16/14 Patient not taking: Reported on 03/04/2015 12/19/13   Jacques Navy, MD   BP 128/75 mmHg  Pulse 90  Temp(Src) 97.3 F (36.3 C) (Oral)  Resp 18  SpO2 94% Physical Exam CONSTITUTIONAL: Well developed/well nourished, pt is uncomfortable appearing HEAD: Normocephalic/atraumatic EYES: EOMI ENMT: Mucous membranes moist NECK: supple no meningeal signs SPINE/BACK:entire spine nontender CV: S1/S2 noted, no murmurs/rubs/gallops noted LUNGS: Lungs are clear to auscultation bilaterally, no apparent distress ABDOMEN: soft, nontender, no rebound or guarding, bowel sounds noted throughout abdomen WU:[id number] cva tenderness NEURO: Pt is awake/alert/appropriate, moves all extremitiesx4.  No facial droop.   EXTREMITIES: pulses normal/equal, full ROM SKIN: warm, color normal PSYCH: anxious  ED Course  Procedures   Medications  fentaNYL (SUBLIMAZE) 0.05 MG/ML injection (not administered)  fentaNYL (SUBLIMAZE) injection 50 mcg (50 mcg Nasal Given 03/04/15 2034)  HYDROmorphone (DILAUDID) injection 1 mg (1 mg Intravenous Given 03/04/15 2146)  sodium chloride 0.9 %  bolus 1,000 mL (1,000 mLs Intravenous New Bag/Given 03/04/15 2145)  HYDROmorphone (DILAUDID) injection 1 mg (1 mg Intravenous Given 03/04/15 2215)    Labs Review Labs Reviewed  COMPREHENSIVE METABOLIC PANEL - Abnormal; Notable for the following:    Glucose, Bld 164 (*)    GFR calc non Af Amer 74 (*)    GFR calc Af Amer 86 (*)    All other components within normal  limits  CBC WITH DIFFERENTIAL/PLATELET - Abnormal; Notable for the following:    WBC 19.4 (*)    Neutrophils Relative % 83 (*)    Neutro Abs 16.1 (*)    All other components within normal limits  URINALYSIS, ROUTINE W REFLEX MICROSCOPIC   11:31 PM PT WITH H/O LIKELY URETERAL STONE GIVEN HISTORY/EXAM HE IS MUCH IMPROVED, SMILING AND IN NO DISTRESS GIVEN COMPLETE RESOLUTION OF PAIN I DON'T FEEL IMAGING REQUIRED PT IS AGREEABLE WITH THIS PLAN    MDM   Final diagnoses:  Flank pain    Nursing notes including past medical history and social history reviewed and considered in documentation Labs/vital reviewed myself and considered during evaluation     Zadie Rhineonald Lurene Robley, MD 03/04/15 2332

## 2015-03-04 NOTE — Discharge Instructions (Signed)
YOU WERE GIVEN FENTANYL AND DILAUDID FOR YOUR SEVERE PAIN DUE TO KIDNEY STONES.

## 2015-03-04 NOTE — ED Notes (Signed)
The patient has had kidney stones before and he knows this pain is similar.  He is haivng right sided flank pain and rates it 9/10.  He says he usually has blood in his urine but this time he denies hematuria.

## 2020-12-03 ENCOUNTER — Ambulatory Visit (INDEPENDENT_AMBULATORY_CARE_PROVIDER_SITE_OTHER): Payer: BC Managed Care – PPO

## 2020-12-03 ENCOUNTER — Ambulatory Visit (INDEPENDENT_AMBULATORY_CARE_PROVIDER_SITE_OTHER): Payer: BC Managed Care – PPO | Admitting: Podiatry

## 2020-12-03 ENCOUNTER — Other Ambulatory Visit: Payer: Self-pay

## 2020-12-03 DIAGNOSIS — M722 Plantar fascial fibromatosis: Secondary | ICD-10-CM | POA: Diagnosis not present

## 2020-12-03 DIAGNOSIS — M79673 Pain in unspecified foot: Secondary | ICD-10-CM | POA: Diagnosis not present

## 2020-12-03 MED ORDER — MELOXICAM 15 MG PO TABS: 15.0000 mg | ORAL_TABLET | Freq: Every day | ORAL | 0 refills | Status: DC

## 2020-12-03 MED ORDER — TRIAMCINOLONE ACETONIDE 10 MG/ML IJ SUSP
10.0000 mg | Freq: Once | INTRAMUSCULAR | Status: AC
  Administered 2020-12-03: 10 mg

## 2020-12-03 NOTE — Patient Instructions (Signed)
For instructions on how to put on your Plantar Fascial Brace, please visit [email or web address]   Plantar Fasciitis (Heel Spur Syndrome) with Rehab The plantar fascia is a fibrous, ligament-like, soft-tissue structure that spans the bottom of the foot. Plantar fasciitis is a condition that causes pain in the foot due to inflammation of the tissue. SYMPTOMS   Pain and tenderness on the underneath side of the foot.  Pain that worsens with standing or walking. CAUSES  Plantar fasciitis is caused by irritation and injury to the plantar fascia on the underneath side of the foot. Common mechanisms of injury include:  Direct trauma to bottom of the foot.  Damage to a small nerve that runs under the foot where the main fascia attaches to the heel bone.  Stress placed on the plantar fascia due to bone spurs. RISK INCREASES WITH:   Activities that place stress on the plantar fascia (running, jumping, pivoting, or cutting).  Poor strength and flexibility.  Improperly fitted shoes.  Tight calf muscles.  Flat feet.  Failure to warm-up properly before activity.  Obesity. PREVENTION  Warm up and stretch properly before activity.  Allow for adequate recovery between workouts.  Maintain physical fitness:  Strength, flexibility, and endurance.  Cardiovascular fitness.  Maintain a health body weight.  Avoid stress on the plantar fascia.  Wear properly fitted shoes, including arch supports for individuals who have flat feet.  PROGNOSIS  If treated properly, then the symptoms of plantar fasciitis usually resolve without surgery. However, occasionally surgery is necessary.  RELATED COMPLICATIONS   Recurrent symptoms that may result in a chronic condition.  Problems of the lower back that are caused by compensating for the injury, such as limping.  Pain or weakness of the foot during push-off following surgery.  Chronic inflammation, scarring, and partial or complete  fascia tear, occurring more often from repeated injections.  TREATMENT  Treatment initially involves the use of ice and medication to help reduce pain and inflammation. The use of strengthening and stretching exercises may help reduce pain with activity, especially stretches of the Achilles tendon. These exercises may be performed at home or with a therapist. Your caregiver may recommend that you use heel cups of arch supports to help reduce stress on the plantar fascia. Occasionally, corticosteroid injections are given to reduce inflammation. If symptoms persist for greater than 6 months despite non-surgical (conservative), then surgery may be recommended.   MEDICATION   If pain medication is necessary, then nonsteroidal anti-inflammatory medications, such as aspirin and ibuprofen, or other minor pain relievers, such as acetaminophen, are often recommended.  Do not take pain medication within 7 days before surgery.  Prescription pain relievers may be given if deemed necessary by your caregiver. Use only as directed and only as much as you need.  Corticosteroid injections may be given by your caregiver. These injections should be reserved for the most serious cases, because they may only be given a certain number of times.  HEAT AND COLD  Cold treatment (icing) relieves pain and reduces inflammation. Cold treatment should be applied for 10 to 15 minutes every 2 to 3 hours for inflammation and pain and immediately after any activity that aggravates your symptoms. Use ice packs or massage the area with a piece of ice (ice massage).  Heat treatment may be used prior to performing the stretching and strengthening activities prescribed by your caregiver, physical therapist, or athletic trainer. Use a heat pack or soak the injury in warm water.  SEEK IMMEDIATE MEDICAL   CARE IF:  Treatment seems to offer no benefit, or the condition worsens.  Any medications produce adverse side effects.   EXERCISES- RANGE OF MOTION (ROM) AND STRETCHING EXERCISES - Plantar Fasciitis (Heel Spur Syndrome) These exercises may help you when beginning to rehabilitate your injury. Your symptoms may resolve with or without further involvement from your physician, physical therapist or athletic trainer. While completing these exercises, remember:   Restoring tissue flexibility helps normal motion to return to the joints. This allows healthier, less painful movement and activity.  An effective stretch should be held for at least 30 seconds.  A stretch should never be painful. You should only feel a gentle lengthening or release in the stretched tissue.  RANGE OF MOTION - Toe Extension, Flexion  Sit with your right / left leg crossed over your opposite knee.  Grasp your toes and gently pull them back toward the top of your foot. You should feel a stretch on the bottom of your toes and/or foot.  Hold this stretch for 10 seconds.  Now, gently pull your toes toward the bottom of your foot. You should feel a stretch on the top of your toes and or foot.  Hold this stretch for 10 seconds. Repeat  times. Complete this stretch 3 times per day.   RANGE OF MOTION - Ankle Dorsiflexion, Active Assisted  Remove shoes and sit on a chair that is preferably not on a carpeted surface.  Place right / left foot under knee. Extend your opposite leg for support.  Keeping your heel down, slide your right / left foot back toward the chair until you feel a stretch at your ankle or calf. If you do not feel a stretch, slide your bottom forward to the edge of the chair, while still keeping your heel down.  Hold this stretch for 10 seconds. Repeat 3 times. Complete this stretch 2 times per day.   STRETCH  Gastroc, Standing  Place hands on wall.  Extend right / left leg, keeping the front knee somewhat bent.  Slightly point your toes inward on your back foot.  Keeping your right / left heel on the floor and your  knee straight, shift your weight toward the wall, not allowing your back to arch.  You should feel a gentle stretch in the right / left calf. Hold this position for 10 seconds. Repeat 3 times. Complete this stretch 2 times per day.  STRETCH  Soleus, Standing  Place hands on wall.  Extend right / left leg, keeping the other knee somewhat bent.  Slightly point your toes inward on your back foot.  Keep your right / left heel on the floor, bend your back knee, and slightly shift your weight over the back leg so that you feel a gentle stretch deep in your back calf.  Hold this position for 10 seconds. Repeat 3 times. Complete this stretch 2 times per day.  STRETCH  Gastrocsoleus, Standing  Note: This exercise can place a lot of stress on your foot and ankle. Please complete this exercise only if specifically instructed by your caregiver.   Place the ball of your right / left foot on a step, keeping your other foot firmly on the same step.  Hold on to the wall or a rail for balance.  Slowly lift your other foot, allowing your body weight to press your heel down over the edge of the step.  You should feel a stretch in your right / left calf.  Hold this   position for 10 seconds.  Repeat this exercise with a slight bend in your right / left knee. Repeat 3 times. Complete this stretch 2 times per day.   STRENGTHENING EXERCISES - Plantar Fasciitis (Heel Spur Syndrome)  These exercises may help you when beginning to rehabilitate your injury. They may resolve your symptoms with or without further involvement from your physician, physical therapist or athletic trainer. While completing these exercises, remember:   Muscles can gain both the endurance and the strength needed for everyday activities through controlled exercises.  Complete these exercises as instructed by your physician, physical therapist or athletic trainer. Progress the resistance and repetitions only as guided.  STRENGTH -  Towel Curls  Sit in a chair positioned on a non-carpeted surface.  Place your foot on a towel, keeping your heel on the floor.  Pull the towel toward your heel by only curling your toes. Keep your heel on the floor. Repeat 3 times. Complete this exercise 2 times per day.  STRENGTH - Ankle Inversion  Secure one end of a rubber exercise band/tubing to a fixed object (table, pole). Loop the other end around your foot just before your toes.  Place your fists between your knees. This will focus your strengthening at your ankle.  Slowly, pull your big toe up and in, making sure the band/tubing is positioned to resist the entire motion.  Hold this position for 10 seconds.  Have your muscles resist the band/tubing as it slowly pulls your foot back to the starting position. Repeat 3 times. Complete this exercises 2 times per day.  Document Released: 11/09/2005 Document Revised: 02/01/2012 Document Reviewed: 02/21/2009 [employer] Patient Information 2014 [employer]. 

## 2020-12-08 NOTE — Progress Notes (Signed)
Subjective:   Patient ID: Aaron Singleton, male   DOB: 35 y.o.   MRN: [id number]   HPI 35 year old male presents the office today for concerns of bilateral heel pain as well as pain going into the arch of the foot which is been ongoing about 1 year.  He has been taking diclofenac in the morning which does help when he takes it.  He only takes this once in the morning.  He states the left is equal to the right and gets pain at morning when he first gets up and also gets worse throughout the day.  Denies any recent injury.  No swelling.  He has no other concerns today.   Review of Systems  All other systems reviewed and are negative.  Past Medical History:  Diagnosis Date  . ADD (attention deficit disorder)   . Hypoglycemia, unspecified    reactive hypoglycemia in the past    Past Surgical History:  Procedure Laterality Date  . LACERATION REPAIR     left zygoma along with underlying fracture     Current Outpatient Medications:  .  meloxicam (MOBIC) 15 MG tablet, Take 1 tablet (15 mg total) by mouth daily., Disp: 30 tablet, Rfl: 0 .  oxyCODONE-acetaminophen (PERCOCET/ROXICET) 5-325 MG per tablet, Take 1 tablet by mouth every 4 (four) hours as needed for severe pain., Disp: 15 tablet, Rfl: 0  Allergies  Allergen Reactions  . Codeine     REACTION: Itching         Objective:  Physical Exam  General: AAO x3, NAD  Dermatological: Skin is warm, dry and supple bilateral. There are no open sores, no preulcerative lesions, no rash or signs of infection present.  Vascular: Dorsalis Pedis artery and Posterior Tibial artery pedal pulses are 2/4 bilateral with immedate capillary fill time. There is no pain with calf compression, swelling, warmth, erythema.   Neruologic: Grossly intact via light touch bilateral. Negative tinel sign.   Musculoskeletal: There is tenderness palpation on plantar medial tubercle of the calcaneus at the insertion of the plantar fascia bilaterally.  Mild  discomfort the arch of the foot as well as the course of plantar fascia.  The plantar fascial appears to be intact.  There is no pain in the Achilles tendon.  Achilles tendon is intact.  No edema, erythema.  Muscular strength 5/5 in all groups tested bilateral. Cavus foot type.   Gait: Unassisted, Nonantalgic.       Assessment:   35 year old male with foot pain, plantar fasciitis     Plan:  -Treatment options discussed including all alternatives, risks, and complications -Etiology of symptoms were discussed -X-rays were obtained and reviewed with the patient. There is no evidence of acute fracture or stress fracture identified today. -Bilateral steroid injection performed.  See procedure note below. -He has a night splint at home -Plantar fascial brace dispensed x2 -Discussed orthotics.  He was seen today by our pedorthotist, Raiford Noble, for measurement of orthotics.  -Stretching/icing daily  Procedure: Injection Tendon/Ligament Discussed alternatives, risks, complications and verbal consent was obtained.  Location: Bilateral plantar fascia at the glabrous junction; medial approach. Skin Prep: Alcohol  Injectate: 0.5cc 0.5% marcaine plain, 0.5 cc 2% lidocaine plain and, 1 cc kenalog 10. Disposition: Patient tolerated procedure well. Injection site dressed with a band-aid.  Post-injection care was discussed and return precautions discussed.    Return for heel pain, plantar fasciitis.  Vivi Barrack DPM

## 2021-01-07 ENCOUNTER — Ambulatory Visit: Payer: BC Managed Care – PPO | Admitting: Podiatry

## 2021-01-29 ENCOUNTER — Other Ambulatory Visit: Payer: Self-pay

## 2021-01-29 ENCOUNTER — Ambulatory Visit (INDEPENDENT_AMBULATORY_CARE_PROVIDER_SITE_OTHER): Payer: BC Managed Care – PPO | Admitting: Podiatry

## 2021-01-29 DIAGNOSIS — M722 Plantar fascial fibromatosis: Secondary | ICD-10-CM | POA: Diagnosis not present

## 2021-01-29 DIAGNOSIS — M7730 Calcaneal spur, unspecified foot: Secondary | ICD-10-CM

## 2021-01-30 ENCOUNTER — Encounter: Payer: Self-pay | Admitting: Podiatry

## 2021-01-30 NOTE — Progress Notes (Signed)
  Subjective:  Patient ID: Aaron Singleton, male    DOB: August 28, 1986,  MRN: [id number]  Chief Complaint  Patient presents with  . Plantar Fasciitis    PT stated that his plantar fasciitis has flared back up     35 y.o. male presents with the above complaint.  Patient presents with a flareup of plantar fasciitis.  Patient states that she he was treated by Dr. Loreta Ave who gave her bracing and had discussed getting orthotics however he did not get the orthotics.  He states the pain eventually came back again.  He would like to rediscuss treatment plans.  He denies any other acute complaints.  He does not have his older brace as it was never started to wear out.   Review of Systems: Negative except as noted in the HPI. Denies N/V/F/Ch.  Past Medical History:  Diagnosis Date  . ADD (attention deficit disorder)   . Hypoglycemia, unspecified    reactive hypoglycemia in the past    Current Outpatient Medications:  .  meloxicam (MOBIC) 15 MG tablet, Take 1 tablet (15 mg total) by mouth daily., Disp: 30 tablet, Rfl: 0 .  oxyCODONE-acetaminophen (PERCOCET/ROXICET) 5-325 MG per tablet, Take 1 tablet by mouth every 4 (four) hours as needed for severe pain., Disp: 15 tablet, Rfl: 0  Social History   Tobacco Use  Smoking Status Never Smoker  Smokeless Tobacco Never Used    Allergies  Allergen Reactions  . Codeine     REACTION: Itching   Objective:  There were no vitals filed for this visit. There is no height or weight on file to calculate BMI. Constitutional Well developed. Well nourished.  Vascular Dorsalis pedis pulses palpable bilaterally. Posterior tibial pulses palpable bilaterally. Capillary refill normal to all digits.  No cyanosis or clubbing noted. Pedal hair growth normal.  Neurologic Normal speech. Oriented to person, place, and time. Epicritic sensation to light touch grossly present bilaterally.  Dermatologic Nails well groomed and normal in appearance. No open  wounds. No skin lesions.  Orthopedic: Normal joint ROM without pain or crepitus bilaterally. No visible deformities. Tender to palpation at the calcaneal tuber bilaterally. No pain with calcaneal squeeze bilaterally. Ankle ROM diminished range of motion bilaterally. Silfverskiold Test: positive bilaterally.   Radiographs: Taken and reviewed. No acute fractures or dislocations. No evidence of stress fracture.  Plantar heel spur present. Posterior heel spur absent.   Assessment:   1. Plantar fasciitis of right foot   2. Plantar fasciitis of left foot   3. Heel spur, unspecified laterality    Plan:  Patient was evaluated and treated and all questions answered.  Plantar Fasciitis, bilaterally - XR reviewed as above.  - Educated on icing and stretching. Instructions given.  - Injection delivered to the plantar fascia as below. - DME: Plantar Fascial Brace x2 - Pharmacologic management: None  Procedure: Injection Tendon/Ligament Location: Bilateral plantar fascia at the glabrous junction; medial approach. Skin Prep: alcohol Injectate: 0.5 cc 0.5% marcaine plain, 0.5 cc of 1% Lidocaine, 0.5 cc kenalog 10. Disposition: Patient tolerated procedure well. Injection site dressed with a band-aid.  No follow-ups on file.

## 2021-02-03 ENCOUNTER — Telehealth: Payer: Self-pay | Admitting: *Deleted

## 2021-02-03 MED ORDER — MELOXICAM 15 MG PO TABS: 15.0000 mg | ORAL_TABLET | Freq: Every day | ORAL | 0 refills | Status: DC

## 2021-02-03 NOTE — Telephone Encounter (Signed)
That's fine you can refill it

## 2021-02-03 NOTE — Telephone Encounter (Signed)
Patient's wife is calling to request a medication refill (Mobic 15mg ), last seen in office by Dr on 01/29/21. Please advise.

## 2021-02-05 ENCOUNTER — Other Ambulatory Visit: Payer: Self-pay | Admitting: *Deleted

## 2021-02-05 NOTE — Telephone Encounter (Signed)
Called and left Vmessage that medication for Mobic -15 mg has been sent to pharmacy on file.

## 2021-02-25 ENCOUNTER — Other Ambulatory Visit: Payer: Self-pay | Admitting: Podiatry

## 2021-02-25 ENCOUNTER — Other Ambulatory Visit: Payer: Self-pay

## 2021-02-25 ENCOUNTER — Ambulatory Visit (INDEPENDENT_AMBULATORY_CARE_PROVIDER_SITE_OTHER): Payer: BC Managed Care – PPO | Admitting: Podiatry

## 2021-02-25 DIAGNOSIS — M79673 Pain in unspecified foot: Secondary | ICD-10-CM

## 2021-02-25 DIAGNOSIS — M722 Plantar fascial fibromatosis: Secondary | ICD-10-CM

## 2021-02-25 DIAGNOSIS — M7730 Calcaneal spur, unspecified foot: Secondary | ICD-10-CM

## 2021-02-25 NOTE — Progress Notes (Signed)
Patient presents to be casted for orthotics.  A foam impression was casted for his right and left foot.  Patient is a size 10  Patient will be contacted when the orthotics are ready for pick up.

## 2021-02-25 NOTE — Telephone Encounter (Signed)
Please advise 

## 2021-02-26 ENCOUNTER — Encounter: Payer: Self-pay | Admitting: Podiatry

## 2021-02-26 ENCOUNTER — Ambulatory Visit (INDEPENDENT_AMBULATORY_CARE_PROVIDER_SITE_OTHER): Payer: BC Managed Care – PPO | Admitting: Podiatry

## 2021-02-26 DIAGNOSIS — Q666 Other congenital valgus deformities of feet: Secondary | ICD-10-CM | POA: Diagnosis not present

## 2021-02-26 DIAGNOSIS — M722 Plantar fascial fibromatosis: Secondary | ICD-10-CM | POA: Diagnosis not present

## 2021-02-26 NOTE — Progress Notes (Signed)
  Subjective:  Patient ID: Aaron Singleton, male    DOB: 04-15-1986,  MRN: [id number]  Chief Complaint  Patient presents with  . Foot Pain    PT stated that he is still having some pain with his right foot     35 y.o. male presents with the above complaint.  Patient presents with follow-up of flare of plantar fasciitis.  Patient states injection helped however he still has pretty good amount of residual pain right greater than left now.  He states it is stable.  Painful.  It is improving.  He denies any other acute complaints.  He has not obtained his orthotics yet.   Review of Systems: Negative except as noted in the HPI. Denies N/V/F/Ch.  Past Medical History:  Diagnosis Date  . ADD (attention deficit disorder)   . Hypoglycemia, unspecified    reactive hypoglycemia in the past    Current Outpatient Medications:  .  meloxicam (MOBIC) 15 MG tablet, Take 1 tablet (15 mg total) by mouth daily., Disp: 30 tablet, Rfl: 0 .  oxyCODONE-acetaminophen (PERCOCET/ROXICET) 5-325 MG per tablet, Take 1 tablet by mouth every 4 (four) hours as needed for severe pain., Disp: 15 tablet, Rfl: 0  Social History   Tobacco Use  Smoking Status Never Smoker  Smokeless Tobacco Never Used    Allergies  Allergen Reactions  . Codeine     REACTION: Itching   Objective:  There were no vitals filed for this visit. There is no height or weight on file to calculate BMI. Constitutional Well developed. Well nourished.  Vascular Dorsalis pedis pulses palpable bilaterally. Posterior tibial pulses palpable bilaterally. Capillary refill normal to all digits.  No cyanosis or clubbing noted. Pedal hair growth normal.  Neurologic Normal speech. Oriented to person, place, and time. Epicritic sensation to light touch grossly present bilaterally.  Dermatologic Nails well groomed and normal in appearance. No open wounds. No skin lesions.  Orthopedic: Normal joint ROM without pain or crepitus bilaterally. No  visible deformities. Tender to palpation at the calcaneal tuber bilaterally. No pain with calcaneal squeeze bilaterally. Ankle ROM diminished range of motion bilaterally. Silfverskiold Test: positive bilaterally.   Radiographs: Taken and reviewed. No acute fractures or dislocations. No evidence of stress fracture.  Plantar heel spur present. Posterior heel spur absent.   Assessment:   1. Plantar fasciitis of right foot   2. Plantar fasciitis of left foot   3. Pes planovalgus    Plan:  Patient was evaluated and treated and all questions answered.  Plantar Fasciitis, bilaterally - XR reviewed as above.  - Educated on icing and stretching. Instructions given.  -Second injection delivered to the plantar fascia as below. - DME: Plantar Fascial Brace x2 - Pharmacologic management: None  Pes planovalgus -I explained patient the etiology of pes planovalgus and pressure but options were discussed.  Patient will ultimately benefit from orthotics for more prevention to control the hindfoot motion and support the arch of the foot and take the stress away from plantar fascia.  Patient agrees with the plan. -He is awaiting orthotics.  Procedure: Injection Tendon/Ligament Location: Bilateral plantar fascia at the glabrous junction; medial approach. Skin Prep: alcohol Injectate: 0.5 cc 0.5% marcaine plain, 0.5 cc of 1% Lidocaine, 0.5 cc kenalog 10. Disposition: Patient tolerated procedure well. Injection site dressed with a band-aid.  No follow-ups on file.

## 2021-03-26 ENCOUNTER — Other Ambulatory Visit: Payer: Self-pay

## 2021-03-26 ENCOUNTER — Ambulatory Visit (INDEPENDENT_AMBULATORY_CARE_PROVIDER_SITE_OTHER): Payer: BC Managed Care – PPO | Admitting: Podiatry

## 2021-03-26 ENCOUNTER — Encounter: Payer: Self-pay | Admitting: Podiatry

## 2021-03-26 DIAGNOSIS — M722 Plantar fascial fibromatosis: Secondary | ICD-10-CM | POA: Diagnosis not present

## 2021-03-26 DIAGNOSIS — Q666 Other congenital valgus deformities of feet: Secondary | ICD-10-CM | POA: Diagnosis not present

## 2021-03-26 NOTE — Progress Notes (Signed)
  Subjective:  Patient ID: Aaron Singleton, male    DOB: September 11, 1986,  MRN: [id number]  Chief Complaint  Patient presents with  . Plantar Fasciitis    Pt stated that he is doing well he has very little pain     35 y.o. male presents with the above complaint.  Patient presents with follow-up of flare of plantar fasciitis.  He states the injection has helped considerably.  He does not have any further pain.  He is also here to pick up his orthotics today.   Review of Systems: Negative except as noted in the HPI. Denies N/V/F/Ch.  Past Medical History:  Diagnosis Date  . ADD (attention deficit disorder)   . Hypoglycemia, unspecified    reactive hypoglycemia in the past    Current Outpatient Medications:  .  meloxicam (MOBIC) 15 MG tablet, TAKE 1 TABLET (15 MG TOTAL) BY MOUTH DAILY., Disp: 30 tablet, Rfl: 0 .  oxyCODONE-acetaminophen (PERCOCET/ROXICET) 5-325 MG per tablet, Take 1 tablet by mouth every 4 (four) hours as needed for severe pain., Disp: 15 tablet, Rfl: 0  Social History   Tobacco Use  Smoking Status Never Smoker  Smokeless Tobacco Never Used    Allergies  Allergen Reactions  . Codeine     REACTION: Itching   Objective:  There were no vitals filed for this visit. There is no height or weight on file to calculate BMI. Constitutional Well developed. Well nourished.  Vascular Dorsalis pedis pulses palpable bilaterally. Posterior tibial pulses palpable bilaterally. Capillary refill normal to all digits.  No cyanosis or clubbing noted. Pedal hair growth normal.  Neurologic Normal speech. Oriented to person, place, and time. Epicritic sensation to light touch grossly present bilaterally.  Dermatologic Nails well groomed and normal in appearance. No open wounds. No skin lesions.  Orthopedic: Normal joint ROM without pain or crepitus bilaterally. No visible deformities. No tender to palpation at the calcaneal tuber bilaterally. No pain with calcaneal squeeze  bilaterally. Ankle ROM diminished range of motion bilaterally. Silfverskiold Test: positive bilaterally.   Radiographs: Taken and reviewed. No acute fractures or dislocations. No evidence of stress fracture.  Plantar heel spur present. Posterior heel spur absent.   Assessment:   1. Plantar fasciitis of right foot   2. Plantar fasciitis of left foot   3. Pes planovalgus    Plan:  Patient was evaluated and treated and all questions answered.  Plantar Fasciitis, bilaterally -Clinically healed with steroid injection plantar fascial braces.  I discussed with him the importance of shoe gear modification wearing his orthotics and if any foot and ankle issues arise to come back and see me right away.  He states understanding.  Pes planovalgus -I explained patient the etiology of pes planovalgus and pressure but options were discussed.  Patient will ultimately benefit from orthotics for more prevention to control the hindfoot motion and support the arch of the foot and take the stress away from plantar fascia.  Patient agrees with the plan. -Orthotics are dispensed and is functioning well.   No follow-ups on file.

## 2021-04-01 ENCOUNTER — Other Ambulatory Visit: Payer: Self-pay | Admitting: Podiatry

## 2021-04-01 NOTE — Telephone Encounter (Signed)
Please advise 

## 2021-05-03 ENCOUNTER — Other Ambulatory Visit: Payer: Self-pay | Admitting: Podiatry

## 2021-05-06 NOTE — Telephone Encounter (Signed)
Please advise 

## 2021-06-07 ENCOUNTER — Other Ambulatory Visit: Payer: Self-pay | Admitting: Podiatry

## 2021-06-09 NOTE — Telephone Encounter (Signed)
Please advise 

## 2021-12-02 DIAGNOSIS — M9901 Segmental and somatic dysfunction of cervical region: Secondary | ICD-10-CM | POA: Diagnosis not present

## 2021-12-09 DIAGNOSIS — M9901 Segmental and somatic dysfunction of cervical region: Secondary | ICD-10-CM | POA: Diagnosis not present

## 2021-12-17 DIAGNOSIS — M9901 Segmental and somatic dysfunction of cervical region: Secondary | ICD-10-CM | POA: Diagnosis not present

## 2021-12-24 DIAGNOSIS — M9901 Segmental and somatic dysfunction of cervical region: Secondary | ICD-10-CM | POA: Diagnosis not present

## 2021-12-30 DIAGNOSIS — M9901 Segmental and somatic dysfunction of cervical region: Secondary | ICD-10-CM | POA: Diagnosis not present

## 2022-01-07 DIAGNOSIS — M9901 Segmental and somatic dysfunction of cervical region: Secondary | ICD-10-CM | POA: Diagnosis not present

## 2022-01-14 DIAGNOSIS — M9902 Segmental and somatic dysfunction of thoracic region: Secondary | ICD-10-CM | POA: Diagnosis not present

## 2022-01-14 DIAGNOSIS — M62838 Other muscle spasm: Secondary | ICD-10-CM | POA: Diagnosis not present

## 2022-01-14 DIAGNOSIS — M542 Cervicalgia: Secondary | ICD-10-CM | POA: Diagnosis not present

## 2022-01-14 DIAGNOSIS — M9901 Segmental and somatic dysfunction of cervical region: Secondary | ICD-10-CM | POA: Diagnosis not present

## 2022-01-15 DIAGNOSIS — M9901 Segmental and somatic dysfunction of cervical region: Secondary | ICD-10-CM | POA: Diagnosis not present

## 2022-01-21 DIAGNOSIS — M9901 Segmental and somatic dysfunction of cervical region: Secondary | ICD-10-CM | POA: Diagnosis not present

## 2022-01-21 DIAGNOSIS — M62838 Other muscle spasm: Secondary | ICD-10-CM | POA: Diagnosis not present

## 2022-01-21 DIAGNOSIS — M9902 Segmental and somatic dysfunction of thoracic region: Secondary | ICD-10-CM | POA: Diagnosis not present

## 2022-01-21 DIAGNOSIS — M542 Cervicalgia: Secondary | ICD-10-CM | POA: Diagnosis not present

## 2022-02-05 DIAGNOSIS — M542 Cervicalgia: Secondary | ICD-10-CM | POA: Diagnosis not present

## 2022-02-05 DIAGNOSIS — M9902 Segmental and somatic dysfunction of thoracic region: Secondary | ICD-10-CM | POA: Diagnosis not present

## 2022-02-05 DIAGNOSIS — M9901 Segmental and somatic dysfunction of cervical region: Secondary | ICD-10-CM | POA: Diagnosis not present

## 2022-02-05 DIAGNOSIS — M62838 Other muscle spasm: Secondary | ICD-10-CM | POA: Diagnosis not present

## 2022-02-10 DIAGNOSIS — M62838 Other muscle spasm: Secondary | ICD-10-CM | POA: Diagnosis not present

## 2022-02-10 DIAGNOSIS — M542 Cervicalgia: Secondary | ICD-10-CM | POA: Diagnosis not present

## 2022-02-10 DIAGNOSIS — M9902 Segmental and somatic dysfunction of thoracic region: Secondary | ICD-10-CM | POA: Diagnosis not present

## 2022-02-10 DIAGNOSIS — H1031 Unspecified acute conjunctivitis, right eye: Secondary | ICD-10-CM | POA: Diagnosis not present

## 2022-02-10 DIAGNOSIS — M9901 Segmental and somatic dysfunction of cervical region: Secondary | ICD-10-CM | POA: Diagnosis not present

## 2022-02-11 DIAGNOSIS — M542 Cervicalgia: Secondary | ICD-10-CM | POA: Diagnosis not present

## 2022-02-11 DIAGNOSIS — M9902 Segmental and somatic dysfunction of thoracic region: Secondary | ICD-10-CM | POA: Diagnosis not present

## 2022-02-11 DIAGNOSIS — M62838 Other muscle spasm: Secondary | ICD-10-CM | POA: Diagnosis not present

## 2022-02-11 DIAGNOSIS — M9901 Segmental and somatic dysfunction of cervical region: Secondary | ICD-10-CM | POA: Diagnosis not present

## 2022-02-17 DIAGNOSIS — M62838 Other muscle spasm: Secondary | ICD-10-CM | POA: Diagnosis not present

## 2022-02-17 DIAGNOSIS — M9901 Segmental and somatic dysfunction of cervical region: Secondary | ICD-10-CM | POA: Diagnosis not present

## 2022-02-17 DIAGNOSIS — M9902 Segmental and somatic dysfunction of thoracic region: Secondary | ICD-10-CM | POA: Diagnosis not present

## 2022-02-17 DIAGNOSIS — M542 Cervicalgia: Secondary | ICD-10-CM | POA: Diagnosis not present

## 2022-02-18 DIAGNOSIS — M62838 Other muscle spasm: Secondary | ICD-10-CM | POA: Diagnosis not present

## 2022-02-18 DIAGNOSIS — M9902 Segmental and somatic dysfunction of thoracic region: Secondary | ICD-10-CM | POA: Diagnosis not present

## 2022-02-18 DIAGNOSIS — M542 Cervicalgia: Secondary | ICD-10-CM | POA: Diagnosis not present

## 2022-02-18 DIAGNOSIS — M9901 Segmental and somatic dysfunction of cervical region: Secondary | ICD-10-CM | POA: Diagnosis not present

## 2022-02-24 DIAGNOSIS — M542 Cervicalgia: Secondary | ICD-10-CM | POA: Diagnosis not present

## 2022-02-24 DIAGNOSIS — M9901 Segmental and somatic dysfunction of cervical region: Secondary | ICD-10-CM | POA: Diagnosis not present

## 2022-02-24 DIAGNOSIS — M9902 Segmental and somatic dysfunction of thoracic region: Secondary | ICD-10-CM | POA: Diagnosis not present

## 2022-02-24 DIAGNOSIS — M62838 Other muscle spasm: Secondary | ICD-10-CM | POA: Diagnosis not present

## 2022-02-25 DIAGNOSIS — M9901 Segmental and somatic dysfunction of cervical region: Secondary | ICD-10-CM | POA: Diagnosis not present

## 2022-02-25 DIAGNOSIS — M62838 Other muscle spasm: Secondary | ICD-10-CM | POA: Diagnosis not present

## 2022-02-25 DIAGNOSIS — M9902 Segmental and somatic dysfunction of thoracic region: Secondary | ICD-10-CM | POA: Diagnosis not present

## 2022-02-25 DIAGNOSIS — M542 Cervicalgia: Secondary | ICD-10-CM | POA: Diagnosis not present

## 2022-02-26 DIAGNOSIS — M62838 Other muscle spasm: Secondary | ICD-10-CM | POA: Diagnosis not present

## 2022-02-26 DIAGNOSIS — M9902 Segmental and somatic dysfunction of thoracic region: Secondary | ICD-10-CM | POA: Diagnosis not present

## 2022-02-26 DIAGNOSIS — M542 Cervicalgia: Secondary | ICD-10-CM | POA: Diagnosis not present

## 2022-02-26 DIAGNOSIS — M9901 Segmental and somatic dysfunction of cervical region: Secondary | ICD-10-CM | POA: Diagnosis not present

## 2022-03-11 DIAGNOSIS — M542 Cervicalgia: Secondary | ICD-10-CM | POA: Diagnosis not present

## 2022-03-11 DIAGNOSIS — M9901 Segmental and somatic dysfunction of cervical region: Secondary | ICD-10-CM | POA: Diagnosis not present

## 2022-03-11 DIAGNOSIS — M9902 Segmental and somatic dysfunction of thoracic region: Secondary | ICD-10-CM | POA: Diagnosis not present

## 2022-03-11 DIAGNOSIS — M62838 Other muscle spasm: Secondary | ICD-10-CM | POA: Diagnosis not present

## 2022-03-12 DIAGNOSIS — M9901 Segmental and somatic dysfunction of cervical region: Secondary | ICD-10-CM | POA: Diagnosis not present

## 2022-03-12 DIAGNOSIS — M62838 Other muscle spasm: Secondary | ICD-10-CM | POA: Diagnosis not present

## 2022-03-12 DIAGNOSIS — M542 Cervicalgia: Secondary | ICD-10-CM | POA: Diagnosis not present

## 2022-03-12 DIAGNOSIS — M9902 Segmental and somatic dysfunction of thoracic region: Secondary | ICD-10-CM | POA: Diagnosis not present

## 2022-03-17 DIAGNOSIS — M9902 Segmental and somatic dysfunction of thoracic region: Secondary | ICD-10-CM | POA: Diagnosis not present

## 2022-03-17 DIAGNOSIS — M62830 Muscle spasm of back: Secondary | ICD-10-CM | POA: Diagnosis not present

## 2022-03-17 DIAGNOSIS — M9901 Segmental and somatic dysfunction of cervical region: Secondary | ICD-10-CM | POA: Diagnosis not present

## 2022-03-17 DIAGNOSIS — M62838 Other muscle spasm: Secondary | ICD-10-CM | POA: Diagnosis not present

## 2022-03-17 DIAGNOSIS — M542 Cervicalgia: Secondary | ICD-10-CM | POA: Diagnosis not present

## 2022-03-18 DIAGNOSIS — M545 Low back pain, unspecified: Secondary | ICD-10-CM | POA: Diagnosis not present

## 2022-03-18 DIAGNOSIS — M9901 Segmental and somatic dysfunction of cervical region: Secondary | ICD-10-CM | POA: Diagnosis not present

## 2022-03-18 DIAGNOSIS — M62830 Muscle spasm of back: Secondary | ICD-10-CM | POA: Diagnosis not present

## 2022-03-18 DIAGNOSIS — M9903 Segmental and somatic dysfunction of lumbar region: Secondary | ICD-10-CM | POA: Diagnosis not present

## 2022-03-19 DIAGNOSIS — M62830 Muscle spasm of back: Secondary | ICD-10-CM | POA: Diagnosis not present

## 2022-03-19 DIAGNOSIS — M9903 Segmental and somatic dysfunction of lumbar region: Secondary | ICD-10-CM | POA: Diagnosis not present

## 2022-03-19 DIAGNOSIS — M545 Low back pain, unspecified: Secondary | ICD-10-CM | POA: Diagnosis not present

## 2022-03-19 DIAGNOSIS — M9901 Segmental and somatic dysfunction of cervical region: Secondary | ICD-10-CM | POA: Diagnosis not present

## 2022-03-24 DIAGNOSIS — M545 Low back pain, unspecified: Secondary | ICD-10-CM | POA: Diagnosis not present

## 2022-03-24 DIAGNOSIS — M9901 Segmental and somatic dysfunction of cervical region: Secondary | ICD-10-CM | POA: Diagnosis not present

## 2022-03-24 DIAGNOSIS — M62830 Muscle spasm of back: Secondary | ICD-10-CM | POA: Diagnosis not present

## 2022-03-24 DIAGNOSIS — M9903 Segmental and somatic dysfunction of lumbar region: Secondary | ICD-10-CM | POA: Diagnosis not present

## 2022-03-25 DIAGNOSIS — M9901 Segmental and somatic dysfunction of cervical region: Secondary | ICD-10-CM | POA: Diagnosis not present

## 2022-03-25 DIAGNOSIS — M545 Low back pain, unspecified: Secondary | ICD-10-CM | POA: Diagnosis not present

## 2022-03-25 DIAGNOSIS — M9903 Segmental and somatic dysfunction of lumbar region: Secondary | ICD-10-CM | POA: Diagnosis not present

## 2022-03-25 DIAGNOSIS — M62830 Muscle spasm of back: Secondary | ICD-10-CM | POA: Diagnosis not present

## 2022-03-26 DIAGNOSIS — M9903 Segmental and somatic dysfunction of lumbar region: Secondary | ICD-10-CM | POA: Diagnosis not present

## 2022-03-26 DIAGNOSIS — M545 Low back pain, unspecified: Secondary | ICD-10-CM | POA: Diagnosis not present

## 2022-03-26 DIAGNOSIS — M9901 Segmental and somatic dysfunction of cervical region: Secondary | ICD-10-CM | POA: Diagnosis not present

## 2022-03-26 DIAGNOSIS — M62830 Muscle spasm of back: Secondary | ICD-10-CM | POA: Diagnosis not present

## 2022-03-31 DIAGNOSIS — M62830 Muscle spasm of back: Secondary | ICD-10-CM | POA: Diagnosis not present

## 2022-03-31 DIAGNOSIS — M545 Low back pain, unspecified: Secondary | ICD-10-CM | POA: Diagnosis not present

## 2022-03-31 DIAGNOSIS — M9903 Segmental and somatic dysfunction of lumbar region: Secondary | ICD-10-CM | POA: Diagnosis not present

## 2022-03-31 DIAGNOSIS — M9901 Segmental and somatic dysfunction of cervical region: Secondary | ICD-10-CM | POA: Diagnosis not present

## 2022-03-31 DIAGNOSIS — M62838 Other muscle spasm: Secondary | ICD-10-CM | POA: Diagnosis not present

## 2022-04-16 DIAGNOSIS — F329 Major depressive disorder, single episode, unspecified: Secondary | ICD-10-CM | POA: Diagnosis not present

## 2022-06-10 DIAGNOSIS — F329 Major depressive disorder, single episode, unspecified: Secondary | ICD-10-CM | POA: Diagnosis not present

## 2022-06-10 DIAGNOSIS — J069 Acute upper respiratory infection, unspecified: Secondary | ICD-10-CM | POA: Diagnosis not present

## 2022-10-02 DIAGNOSIS — J069 Acute upper respiratory infection, unspecified: Secondary | ICD-10-CM | POA: Diagnosis not present

## 2022-10-02 DIAGNOSIS — Z6826 Body mass index (BMI) 26.0-26.9, adult: Secondary | ICD-10-CM | POA: Diagnosis not present

## 2022-10-02 DIAGNOSIS — R051 Acute cough: Secondary | ICD-10-CM | POA: Diagnosis not present
